# Patient Record
Sex: Female | Born: 1990 | Race: White | Hispanic: No | Marital: Married | Smoking: Never smoker
Health system: Southern US, Community
[De-identification: ages and names within clinical notes are randomized; demographics above are authoritative.]

## PROBLEM LIST (undated history)

## (undated) DIAGNOSIS — F419 Anxiety disorder, unspecified: Secondary | ICD-10-CM

## (undated) DIAGNOSIS — I10 Essential (primary) hypertension: Secondary | ICD-10-CM

## (undated) DIAGNOSIS — T7840XA Allergy, unspecified, initial encounter: Secondary | ICD-10-CM

## (undated) HISTORY — PX: JOINT REPLACEMENT: SHX530

## (undated) HISTORY — DX: Allergy, unspecified, initial encounter: T78.40XA

---

## 2008-12-09 ENCOUNTER — Ambulatory Visit: Payer: Self-pay | Admitting: Radiology

## 2008-12-09 ENCOUNTER — Ambulatory Visit (HOSPITAL_BASED_OUTPATIENT_CLINIC_OR_DEPARTMENT_OTHER): Admission: RE | Admit: 2008-12-09 | Discharge: 2008-12-09 | Payer: Self-pay | Admitting: Family Medicine

## 2009-01-14 ENCOUNTER — Ambulatory Visit: Payer: Self-pay | Admitting: Diagnostic Radiology

## 2009-01-14 ENCOUNTER — Ambulatory Visit (HOSPITAL_BASED_OUTPATIENT_CLINIC_OR_DEPARTMENT_OTHER): Admission: RE | Admit: 2009-01-14 | Discharge: 2009-01-14 | Payer: Self-pay | Admitting: Family Medicine

## 2012-09-24 ENCOUNTER — Emergency Department (HOSPITAL_BASED_OUTPATIENT_CLINIC_OR_DEPARTMENT_OTHER)
Admission: EM | Admit: 2012-09-24 | Discharge: 2012-09-24 | Disposition: A | Discharge status: Home / Self Care | Payer: Worker's Compensation | Attending: Emergency Medicine | Admitting: Emergency Medicine

## 2012-09-24 ENCOUNTER — Encounter (HOSPITAL_BASED_OUTPATIENT_CLINIC_OR_DEPARTMENT_OTHER): Payer: Self-pay | Admitting: *Deleted

## 2012-09-24 ENCOUNTER — Emergency Department (HOSPITAL_BASED_OUTPATIENT_CLINIC_OR_DEPARTMENT_OTHER): Payer: Worker's Compensation

## 2012-09-24 DIAGNOSIS — F411 Generalized anxiety disorder: Secondary | ICD-10-CM | POA: Insufficient documentation

## 2012-09-24 DIAGNOSIS — S99929A Unspecified injury of unspecified foot, initial encounter: Secondary | ICD-10-CM | POA: Insufficient documentation

## 2012-09-24 DIAGNOSIS — I10 Essential (primary) hypertension: Secondary | ICD-10-CM | POA: Insufficient documentation

## 2012-09-24 DIAGNOSIS — Z79899 Other long term (current) drug therapy: Secondary | ICD-10-CM | POA: Insufficient documentation

## 2012-09-24 DIAGNOSIS — M25569 Pain in unspecified knee: Secondary | ICD-10-CM

## 2012-09-24 DIAGNOSIS — Y929 Unspecified place or not applicable: Secondary | ICD-10-CM | POA: Insufficient documentation

## 2012-09-24 DIAGNOSIS — S8990XA Unspecified injury of unspecified lower leg, initial encounter: Secondary | ICD-10-CM | POA: Insufficient documentation

## 2012-09-24 DIAGNOSIS — W010XXA Fall on same level from slipping, tripping and stumbling without subsequent striking against object, initial encounter: Secondary | ICD-10-CM | POA: Insufficient documentation

## 2012-09-24 DIAGNOSIS — Y9389 Activity, other specified: Secondary | ICD-10-CM | POA: Insufficient documentation

## 2012-09-24 HISTORY — DX: Anxiety disorder, unspecified: F41.9

## 2012-09-24 HISTORY — DX: Essential (primary) hypertension: I10

## 2012-09-24 MED ORDER — ACETAMINOPHEN-CODEINE 120-12 MG/5ML PO SOLN: 5.0000 mL | Freq: Four times a day (QID) | ORAL | Status: DC | PRN

## 2012-09-24 NOTE — ED Notes (Signed)
MD at bedside. 

## 2012-09-24 NOTE — ED Notes (Signed)
Chart reviewed.

## 2012-09-24 NOTE — ED Provider Notes (Signed)
History     CSN: [id number]  Arrival date & time 09/24/12  1327   First MD Initiated Contact with Patient 09/24/12 1334      Chief Complaint  Patient presents with  . Knee Injury    (Consider location/radiation/quality/duration/timing/severity/associated sxs/prior treatment) HPI The patient presents immediately following a fall.  She recalls that she was walking, slipped, fell awkwardly backwards, but was called prior to hitting the ground.  She says that her left knee popped while she was in motion, and since that time has had pain persistently in left knee.  It is worse with motion, minimally better at rest.  She denies distal dysesthesia or weakness. No clear alleviating or exacerbating factors. No other,, no other complaints. The patient has a history of prior arthroscopic meniscal repair with patella relocation in the distant past. Past Medical History  Diagnosis Date  . Hypertension   . Anxiety     Past Surgical History  Procedure Date  . Joint replacement     History reviewed. No pertinent family history.  History  Substance Use Topics  . Smoking status: Never Smoker   . Smokeless tobacco: Not on file  . Alcohol Use: No    OB History    Grav Para Term Preterm Abortions TAB SAB Ect Mult Living                  Review of Systems  All other systems reviewed and are negative.    Allergies  Review of patient's allergies indicates no known allergies.  Home Medications   Current Outpatient Rx  Name  Route  Sig  Dispense  Refill  . CITALOPRAM HYDROBROMIDE 10 MG PO TABS   Oral   Take 10 mg by mouth daily.         Marland Kitchen METOPROLOL SUCCINATE ER 100 MG PO TB24   Oral   Take 100 mg by mouth daily. Take with or immediately following a meal.           BP 156/85  Pulse 102  Temp 98.6 F (37 C)  Resp 18  Ht 5\' 2"  (1.575 m)  Wt 250 lb (113.399 kg)  BMI 45.73 kg/m2  SpO2 100%  LMP 09/16/2012  Physical Exam  Nursing note and vitals  reviewed. Constitutional: She is oriented to person, place, and time. She appears well-developed and well-nourished. No distress.  HENT:  Head: Normocephalic and atraumatic.  Eyes: Conjunctivae normal and EOM are normal.  Cardiovascular: Normal rate and regular rhythm.   Pulmonary/Chest: Effort normal and breath sounds normal. No stridor. No respiratory distress.  Abdominal: She exhibits no distension.  Musculoskeletal: She exhibits no edema.       Left hip: Normal.       Right knee: Normal.       Left knee: She exhibits decreased range of motion, effusion and bony tenderness. She exhibits no ecchymosis, no deformity, no laceration, no erythema, normal alignment, no LCL laxity, normal patellar mobility, normal meniscus and no MCL laxity. tenderness found. Patellar tendon tenderness noted. No medial joint line, no lateral joint line, no MCL and no LCL tenderness noted.       Right ankle: Normal.       Left ankle: Normal.  Neurological: She is alert and oriented to person, place, and time. No cranial nerve deficit.  Skin: Skin is warm and dry.  Psychiatric: She has a normal mood and affect.    ED Course  Procedures (including critical care time)  Labs Reviewed -  No data to display No results found.   No diagnosis found.   I reviewed the XR findings with the patient. On re-eval she continues to have mild pain, no new complaints.  MDM  This young female presents after a fall with persistent left knee pain.  On exam she is in no distress.  There is tenderness to palpation with effusion about the patella.  The patient does have flexion and extension capacity.  There is some amount of range of motion secondary to the fusion.  There is mild tenderness to palpation of the patella, though on x-ray it is not grossly dislocated and the subluxation may be reflective of the patient's surgical procedure.  The patient has a knee immobilizer, was advised that she needs to wear this until she follows  up with her orthopedist as soon as possible.  With preserved distal pulses, sensation, neurologic status, there is low suspicion for transient dislocation of the knee.        Gerhard Munch, MD 09/24/12 [phone]

## 2012-09-24 NOTE — ED Notes (Signed)
Pt c/o fall from standing landing on tile floor x 1 hr ago, c/o left knee pain

## 2014-04-01 ENCOUNTER — Encounter (HOSPITAL_BASED_OUTPATIENT_CLINIC_OR_DEPARTMENT_OTHER): Payer: Self-pay | Admitting: Emergency Medicine

## 2014-04-01 ENCOUNTER — Emergency Department (HOSPITAL_BASED_OUTPATIENT_CLINIC_OR_DEPARTMENT_OTHER)
Admission: EM | Admit: 2014-04-01 | Discharge: 2014-04-01 | Disposition: A | Discharge status: Home / Self Care | Payer: BC Managed Care – PPO | Attending: Emergency Medicine | Admitting: Emergency Medicine

## 2014-04-01 DIAGNOSIS — Z79899 Other long term (current) drug therapy: Secondary | ICD-10-CM | POA: Insufficient documentation

## 2014-04-01 DIAGNOSIS — I10 Essential (primary) hypertension: Secondary | ICD-10-CM | POA: Insufficient documentation

## 2014-04-01 DIAGNOSIS — F410 Panic disorder [episodic paroxysmal anxiety] without agoraphobia: Secondary | ICD-10-CM | POA: Insufficient documentation

## 2014-04-01 DIAGNOSIS — I4949 Other premature depolarization: Secondary | ICD-10-CM | POA: Insufficient documentation

## 2014-04-01 DIAGNOSIS — I493 Ventricular premature depolarization: Secondary | ICD-10-CM

## 2014-04-01 DIAGNOSIS — R002 Palpitations: Secondary | ICD-10-CM | POA: Insufficient documentation

## 2014-04-01 LAB — CBC WITH DIFFERENTIAL/PLATELET
BASOS ABS: 0 10*3/uL (ref 0.0–0.1)
Basophils Relative: 0 % (ref 0–1)
EOS PCT: 3 % (ref 0–5)
Eosinophils Absolute: 0.3 10*3/uL (ref 0.0–0.7)
HCT: 41.3 % (ref 36.0–46.0)
Hemoglobin: 14 g/dL (ref 12.0–15.0)
LYMPHS PCT: 26 % (ref 12–46)
Lymphs Abs: 2.9 10*3/uL (ref 0.7–4.0)
MCH: 29.1 pg (ref 26.0–34.0)
MCHC: 33.9 g/dL (ref 30.0–36.0)
MCV: 85.9 fL (ref 78.0–100.0)
Monocytes Absolute: 0.9 10*3/uL (ref 0.1–1.0)
Monocytes Relative: 8 % (ref 3–12)
NEUTROS PCT: 64 % (ref 43–77)
Neutro Abs: 7.3 10*3/uL (ref 1.7–7.7)
PLATELETS: 410 10*3/uL — AB (ref 150–400)
RBC: 4.81 MIL/uL (ref 3.87–5.11)
RDW: 13.2 % (ref 11.5–15.5)
WBC: 11.4 10*3/uL — AB (ref 4.0–10.5)

## 2014-04-01 LAB — BASIC METABOLIC PANEL
BUN: 14 mg/dL (ref 6–23)
CALCIUM: 10.3 mg/dL (ref 8.4–10.5)
CO2: 25 meq/L (ref 19–32)
Chloride: 99 mEq/L (ref 96–112)
Creatinine, Ser: 0.7 mg/dL (ref 0.50–1.10)
GFR calc Af Amer: >90 mL/min (ref >90)
Glucose, Bld: 109 mg/dL — ABNORMAL HIGH (ref 70–99)
POTASSIUM: 4.1 meq/L (ref 3.7–5.3)
SODIUM: 139 meq/L (ref 137–147)

## 2014-04-01 NOTE — Discharge Instructions (Signed)
Palpitations  A palpitation is the feeling that your heartbeat is irregular or is faster than normal. It may feel like your heart is fluttering or skipping a beat. Palpitations are usually not a serious problem. However, in some cases, you may need further medical evaluation. CAUSES  Palpitations can be caused by:  Smoking.  Caffeine or other stimulants, such as diet pills or energy drinks.  Alcohol.  Stress and anxiety.  Strenuous physical activity.  Fatigue.  Certain medicines.  Heart disease, especially if you have a history of arrhythmias. This includes atrial fibrillation, atrial flutter, or supraventricular tachycardia.  An improperly working pacemaker or defibrillator. DIAGNOSIS  To find the cause of your palpitations, your caregiver will take your history and perform a physical exam. Tests may also be done, including:  Electrocardiography (ECG). This test records the heart's electrical activity.  Cardiac monitoring. This allows your caregiver to monitor your heart rate and rhythm in real time.  Holter monitor. This is a portable device that records your heartbeat and can help diagnose heart arrhythmias. It allows your caregiver to track your heart activity for several days, if needed.  Stress tests by exercise or by giving medicine that makes the heart beat faster. TREATMENT  Treatment of palpitations depends on the cause of your symptoms and can vary greatly. Most cases of palpitations do not require any treatment other than time, relaxation, and monitoring your symptoms. Other causes, such as atrial fibrillation, atrial flutter, or supraventricular tachycardia, usually require further treatment. HOME CARE INSTRUCTIONS   Avoid:  Caffeinated coffee, tea, soft drinks, diet pills, and energy drinks.  Chocolate.  Alcohol.  Stop smoking if you smoke.  Reduce your stress and anxiety. Things that can help you relax include:  A method that measures bodily functions so  you can learn to control them (biofeedback).  Yoga.  Meditation.  Physical activity such as swimming, jogging, or walking.  Get plenty of rest and sleep. SEEK MEDICAL CARE IF:   You continue to have a fast or irregular heartbeat beyond 24 hours.  Your palpitations occur more often. SEEK IMMEDIATE MEDICAL CARE IF:  You develop chest pain or shortness of breath.  You have a severe headache.  You feel dizzy, or you faint. MAKE SURE YOU:  Understand these instructions.  Will watch your condition.  Will get help right away if you are not doing well or get worse. Document Released: 10/13/2000 Document Revised: 02/10/2013 Document Reviewed: 12/15/2011 [facility] Patient Information 2014 [place], [place].  Premature Ventricular Contraction Premature ventricular contraction (PVC) is an irregularity of the heart rhythm involving extra or skipped heartbeats. In some cases, they may occur without obvious cause or heart disease. Other times, they can be caused by an electrolyte change in the blood. These need to be corrected. They can also be seen when there is not enough oxygen going to the heart. A common cause of this is plaque or cholesterol buildup. This buildup decreases the blood supply to the heart. In addition, extra beats may be caused or aggravated by:  Excessive smoking.  Alcohol consumption.  Caffeine.  Certain medications  Some street drugs. SYMPTOMS   The sensation of feeling your heart skipping a beat (palpitations).  In many cases, the person may have no symptoms. SIGNS AND TESTS   A physical examination may show an occasional irregularity, but if the PVC beats do not happen often, they may not be found on physical exam.  Blood pressure is usually normal.  Other tests that may find  extra beats of the heart are:  An EKG (electrocardiogram)  A Holter monitor which can monitor your heart over longer periods of time  An Angiogram (study of the heart  arteries). TREATMENT  Usually extra heartbeats do not need treatment. The condition is treated only if symptoms are severe or if extra beats are very frequent or are causing problems. An underlying cause, if discovered, may also require treatment.  Treatment may also be needed if there may be a risk for other more serious cardiac arrhythmias.  PREVENTION   Moderation in caffeine, alcohol, and tobacco use may reduce the risk of ectopic heartbeats in some people.  Exercise often helps people who lead a sedentary (inactive) lifestyle. PROGNOSIS  PVC heartbeats are generally harmless and do not need treatment.  RISKS AND COMPLICATIONS   Ventricular tachycardia (occasionally).  There usually are no complications.  Other arrhythmias (occasionally). SEEK IMMEDIATE MEDICAL CARE IF:   You feel palpitations that are frequent or continual.  You develop chest pain or other problems such as shortness of breath, sweating, or nausea and vomiting.  You become light-headed or faint (pass out).  You get worse or do not improve with treatment. Document Released: 06/02/2004 Document Revised: 01/08/2012 Document Reviewed: 12/13/2007 [facility] Patient Information 2014 [place], [place].

## 2014-04-01 NOTE — ED Provider Notes (Signed)
CSN: [id number]     Arrival date & time 04/01/14  1811 History  This chart was scribed for Connie Bucco, MD, by Connie Valencia, ED Scribe. This patient was seen in room MH02/MH02 and the patient's care was started at 7:16 PM.   First MD Initiated Contact with Patient 04/01/14 1858     Chief Complaint  Patient presents with  . Palpitations    The history is provided by the patient. No language interpreter was used.   HPI Comments: Connie Valencia is a 23 y.o. female, with a h/o anxiety, panic attacks, and PVCs, who presents to the Emergency Department complaining of heart palpitations which occurred twice today. The first episode occurred this morning, approximately five minutes after the pt had experienced a panic attack. The second episode began this evening, after she had been exercising. She states a h/o similar symptoms, though she reports the palpitations usually resolve more quickly. She states the palpitations seem to be correlated to panic attacks, with the experience of palpitations catalyzing a panic attack.  Connie Valencia denies SOB, nausea, emesis, leg swelling, chest pain, cough, congestion, or fever associated with the palpitations. She has worn a halter monitor in the past which indicated PVCs, but she has not been treated by a cardiologist. She states she has abstained from caffeine for 2 years. She also has not used supplements or diet pills. The pt is a non-smoker.   Her PCP is Dr. Riley Valencia with [facility].   Past Medical History  Diagnosis Date  . Hypertension   . Anxiety    Past Surgical History  Procedure Laterality Date  . Joint replacement     History reviewed. No pertinent family history. History  Substance Use Topics  . Smoking status: Never Smoker   . Smokeless tobacco: Not on file  . Alcohol Use: No   No OB history provided.  Review of Systems  Constitutional: Negative for fever, chills, diaphoresis and fatigue.  HENT: Negative for congestion,  rhinorrhea and sneezing.   Eyes: Negative.   Respiratory: Negative for cough, chest tightness and shortness of breath.   Cardiovascular: Positive for palpitations. Negative for chest pain and leg swelling.  Gastrointestinal: Negative for nausea, vomiting, abdominal pain, diarrhea and blood in stool.  Genitourinary: Negative for frequency, hematuria, flank pain and difficulty urinating.  Musculoskeletal: Negative for arthralgias and back pain.  Skin: Negative for rash.  Neurological: Negative for dizziness, speech difficulty, weakness, numbness and headaches.      Allergies  Review of patient's allergies indicates no known allergies.  Home Medications   Prior to Admission medications   Medication Sig Start Date End Date Taking? Authorizing Provider  citalopram (CELEXA) 10 MG tablet Take 10 mg by mouth daily.    Historical Provider, MD  metoprolol succinate (TOPROL-XL) 100 MG 24 hr tablet Take 100 mg by mouth daily. Take with or immediately following a meal.    Historical Provider, MD   Triage Vitals: BP 165/97  Pulse 112  Temp(Src) 99.1 F (37.3 C) (Oral)  Resp 16  Ht 5\' 2"  (1.575 m)  Wt 255 lb (115.667 kg)  BMI 46.63 kg/m2  SpO2 100%  LMP 03/30/2014  Physical Exam  Constitutional: She is oriented to person, place, and time. She appears well-developed and well-nourished.  HENT:  Head: Normocephalic and atraumatic.  Eyes: Pupils are equal, round, and reactive to light.  Neck: Normal range of motion. Neck supple.  Cardiovascular: Normal rate, regular rhythm and normal heart sounds.   Pulmonary/Chest: Effort  normal and breath sounds normal. No respiratory distress. She has no wheezes. She has no rales. She exhibits no tenderness.  Abdominal: Soft. Bowel sounds are normal. There is no tenderness. There is no rebound and no guarding.  Musculoskeletal: Normal range of motion. She exhibits tenderness (no calf tenderness). She exhibits no edema.  Lymphadenopathy:    She has no  cervical adenopathy.  Neurological: She is alert and oriented to person, place, and time.  Skin: Skin is warm and dry. No rash noted.  Psychiatric: She has a normal mood and affect.    ED Course  Procedures (including critical care time)  DIAGNOSTIC STUDIES: Oxygen Saturation is 100% on room air, normal by my interpretation.    COORDINATION OF CARE:  7:23 PM- Discussed treatment plan with patient, and the patient agreed to the plan. The plan includes an EKG and labs.   Labs Review Results for orders placed during the hospital encounter of 04/01/14  CBC WITH DIFFERENTIAL      Result Value Ref Range   WBC 11.4 (*) 4.0 - 10.5 K/uL   RBC 4.81  3.87 - 5.11 MIL/uL   Hemoglobin 14.0  12.0 - 15.0 g/dL   HCT 74.241.3  59.536.0 - 63.846.0 %   MCV 85.9  78.0 - 100.0 fL   MCH 29.1  26.0 - 34.0 pg   MCHC 33.9  30.0 - 36.0 g/dL   RDW 75.613.2  43.311.5 - 29.515.5 %   Platelets 410 (*) 150 - 400 K/uL   Neutrophils Relative % 64  43 - 77 %   Neutro Abs 7.3  1.7 - 7.7 K/uL   Lymphocytes Relative 26  12 - 46 %   Lymphs Abs 2.9  0.7 - 4.0 K/uL   Monocytes Relative 8  3 - 12 %   Monocytes Absolute 0.9  0.1 - 1.0 K/uL   Eosinophils Relative 3  0 - 5 %   Eosinophils Absolute 0.3  0.0 - 0.7 K/uL   Basophils Relative 0  0 - 1 %   Basophils Absolute 0.0  0.0 - 0.1 K/uL  BASIC METABOLIC PANEL      Result Value Ref Range   Sodium 139  137 - 147 mEq/L   Potassium 4.1  3.7 - 5.3 mEq/L   Chloride 99  96 - 112 mEq/L   CO2 25  19 - 32 mEq/L   Glucose, Bld 109 (*) 70 - 99 mg/dL   BUN 14  6 - 23 mg/dL   Creatinine, Ser 1.880.70  0.50 - 1.10 mg/dL   Calcium 41.610.3  8.4 - 60.610.5 mg/dL   GFR calc non Af Amer >90  >90 mL/min   GFR calc Af Amer >90  >90 mL/min   No results found.    Imaging Review No results found.   EKG Interpretation   Date/Time:  Wednesday April 01 2014 18:27:11 EDT Ventricular Rate:  93 PR Interval:  134 QRS Duration: 84 QT Interval:  360 QTC Calculation: 447 R Axis:   55 Text Interpretation:   Sinus rhythm with sinus arrhythmia with occasional  Premature ventricular complexes Otherwise normal ECG No old tracing to  compare Confirmed by Connie Smethurst  MD, Connie Valencia ([id number]) on 04/01/2014 8:01:35 PM      MDM   Final diagnoses:  Palpitations  PVC (premature ventricular contraction)    Patient is having palpitations. Her symptoms seem consistent with PVCs which are noted on EKG. No other arrhythmias are noted. She has not had any symptoms with PVCs such  as dizziness chest pain or shortness of breath. Tablet she can be discharged home with outpatient followup.  I personally performed the services described in this documentation, which was scribed in my presence.  The recorded information has been reviewed and considered.    Connie Bucco, MD 04/01/14 2003

## 2014-04-01 NOTE — ED Notes (Signed)
Pt c/o Palpitations started x 1 hr ago after exercising. Denies SOB n/v

## 2014-04-01 NOTE — ED Notes (Signed)
MD at bedside. 

## 2014-04-15 DIAGNOSIS — R7402 Elevation of levels of lactic acid dehydrogenase (LDH): Secondary | ICD-10-CM | POA: Insufficient documentation

## 2014-04-15 DIAGNOSIS — M25569 Pain in unspecified knee: Secondary | ICD-10-CM | POA: Insufficient documentation

## 2014-04-15 DIAGNOSIS — K7689 Other specified diseases of liver: Secondary | ICD-10-CM | POA: Insufficient documentation

## 2014-04-15 DIAGNOSIS — J302 Other seasonal allergic rhinitis: Secondary | ICD-10-CM | POA: Insufficient documentation

## 2014-04-15 DIAGNOSIS — R079 Chest pain, unspecified: Secondary | ICD-10-CM | POA: Insufficient documentation

## 2014-04-15 DIAGNOSIS — L918 Other hypertrophic disorders of the skin: Secondary | ICD-10-CM | POA: Insufficient documentation

## 2014-04-15 DIAGNOSIS — N920 Excessive and frequent menstruation with regular cycle: Secondary | ICD-10-CM | POA: Insufficient documentation

## 2014-04-15 DIAGNOSIS — H60509 Unspecified acute noninfective otitis externa, unspecified ear: Secondary | ICD-10-CM | POA: Insufficient documentation

## 2014-04-15 DIAGNOSIS — M2390 Unspecified internal derangement of unspecified knee: Secondary | ICD-10-CM | POA: Insufficient documentation

## 2014-04-15 DIAGNOSIS — M7050 Other bursitis of knee, unspecified knee: Secondary | ICD-10-CM | POA: Insufficient documentation

## 2014-04-15 DIAGNOSIS — L239 Allergic contact dermatitis, unspecified cause: Secondary | ICD-10-CM | POA: Insufficient documentation

## 2014-04-15 DIAGNOSIS — J309 Allergic rhinitis, unspecified: Secondary | ICD-10-CM | POA: Insufficient documentation

## 2014-04-15 DIAGNOSIS — I10 Essential (primary) hypertension: Secondary | ICD-10-CM | POA: Insufficient documentation

## 2014-04-15 DIAGNOSIS — L02439 Carbuncle of limb, unspecified: Secondary | ICD-10-CM | POA: Insufficient documentation

## 2014-04-15 DIAGNOSIS — K7581 Nonalcoholic steatohepatitis (NASH): Secondary | ICD-10-CM | POA: Insufficient documentation

## 2014-04-15 DIAGNOSIS — K760 Fatty (change of) liver, not elsewhere classified: Secondary | ICD-10-CM | POA: Insufficient documentation

## 2014-04-15 DIAGNOSIS — N926 Irregular menstruation, unspecified: Secondary | ICD-10-CM | POA: Insufficient documentation

## 2014-04-15 DIAGNOSIS — M25562 Pain in left knee: Secondary | ICD-10-CM | POA: Insufficient documentation

## 2014-04-15 DIAGNOSIS — F419 Anxiety disorder, unspecified: Secondary | ICD-10-CM | POA: Insufficient documentation

## 2014-04-15 DIAGNOSIS — F4322 Adjustment disorder with anxiety: Secondary | ICD-10-CM | POA: Insufficient documentation

## 2014-04-15 DIAGNOSIS — M545 Low back pain, unspecified: Secondary | ICD-10-CM | POA: Insufficient documentation

## 2014-04-28 IMAGING — CR DG KNEE COMPLETE 4+V*L*
4 series · 4 of 4 positions shown · non-contrast
Comparison: None.

CLINICAL DATA: Pain post trauma

LEFT KNEE - COMPLETE 4+ VIEW

[t knee ap left]
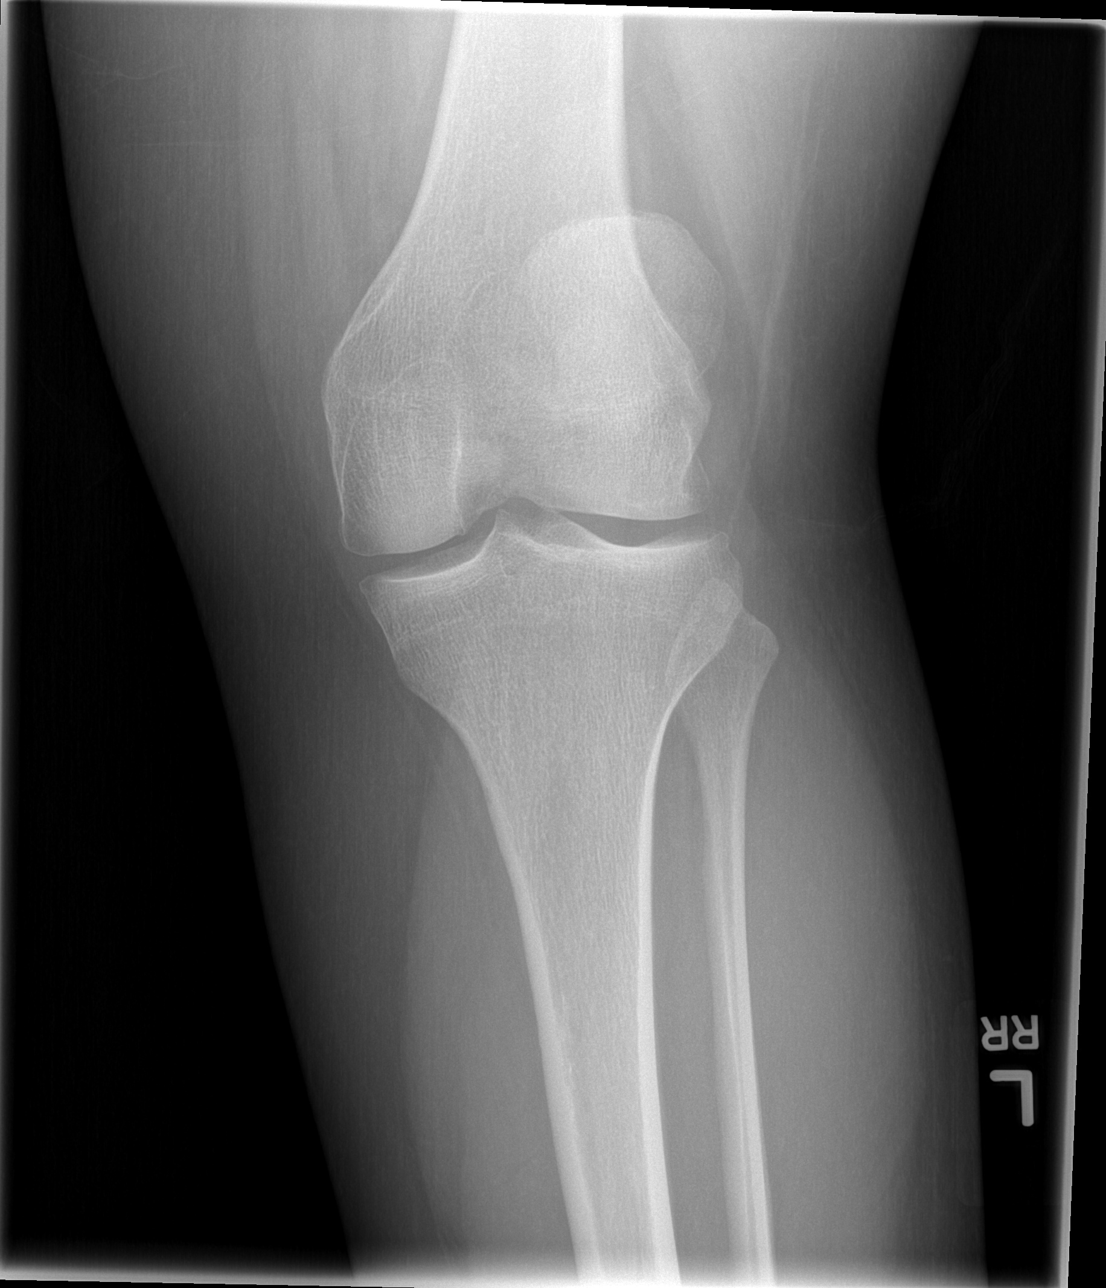

[t knee oblique left (1 of 2)]
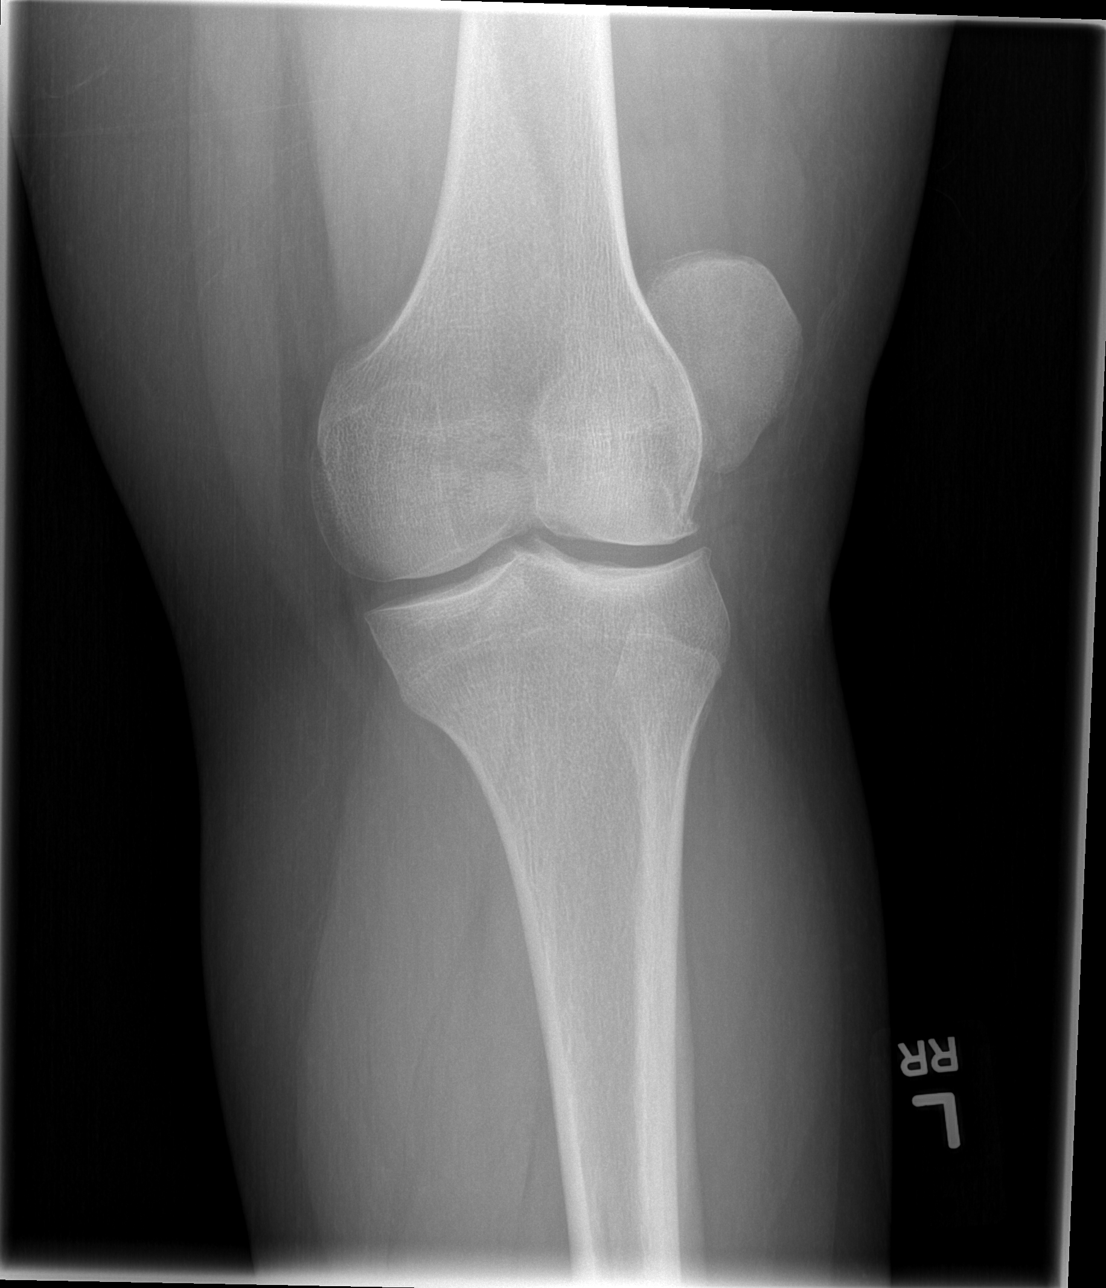

[t knee oblique left (2 of 2)]
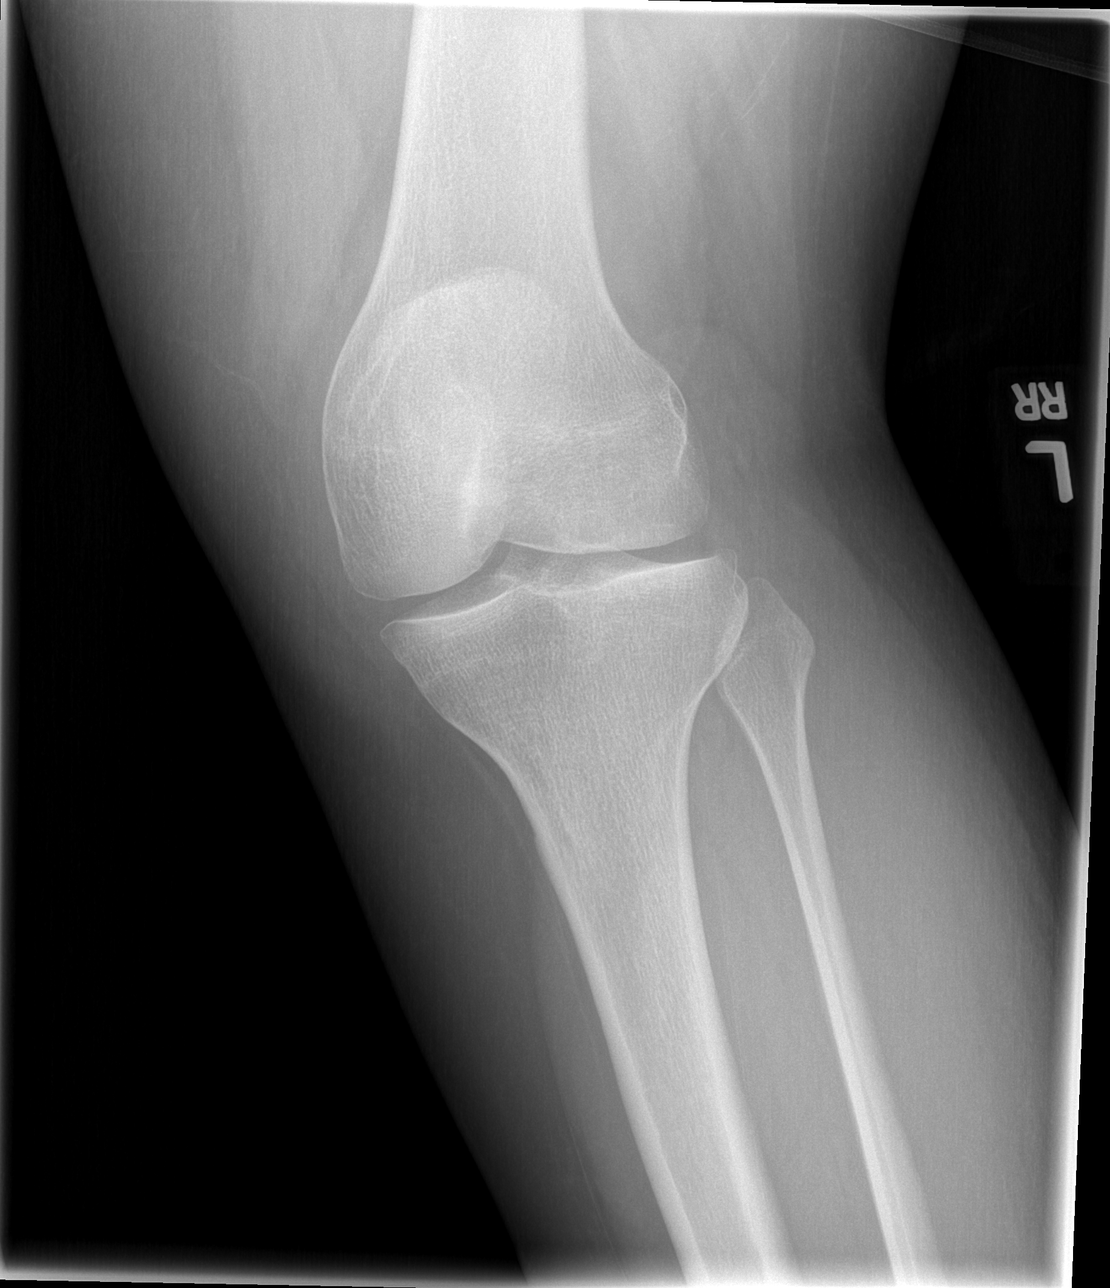

[t knee lat left]
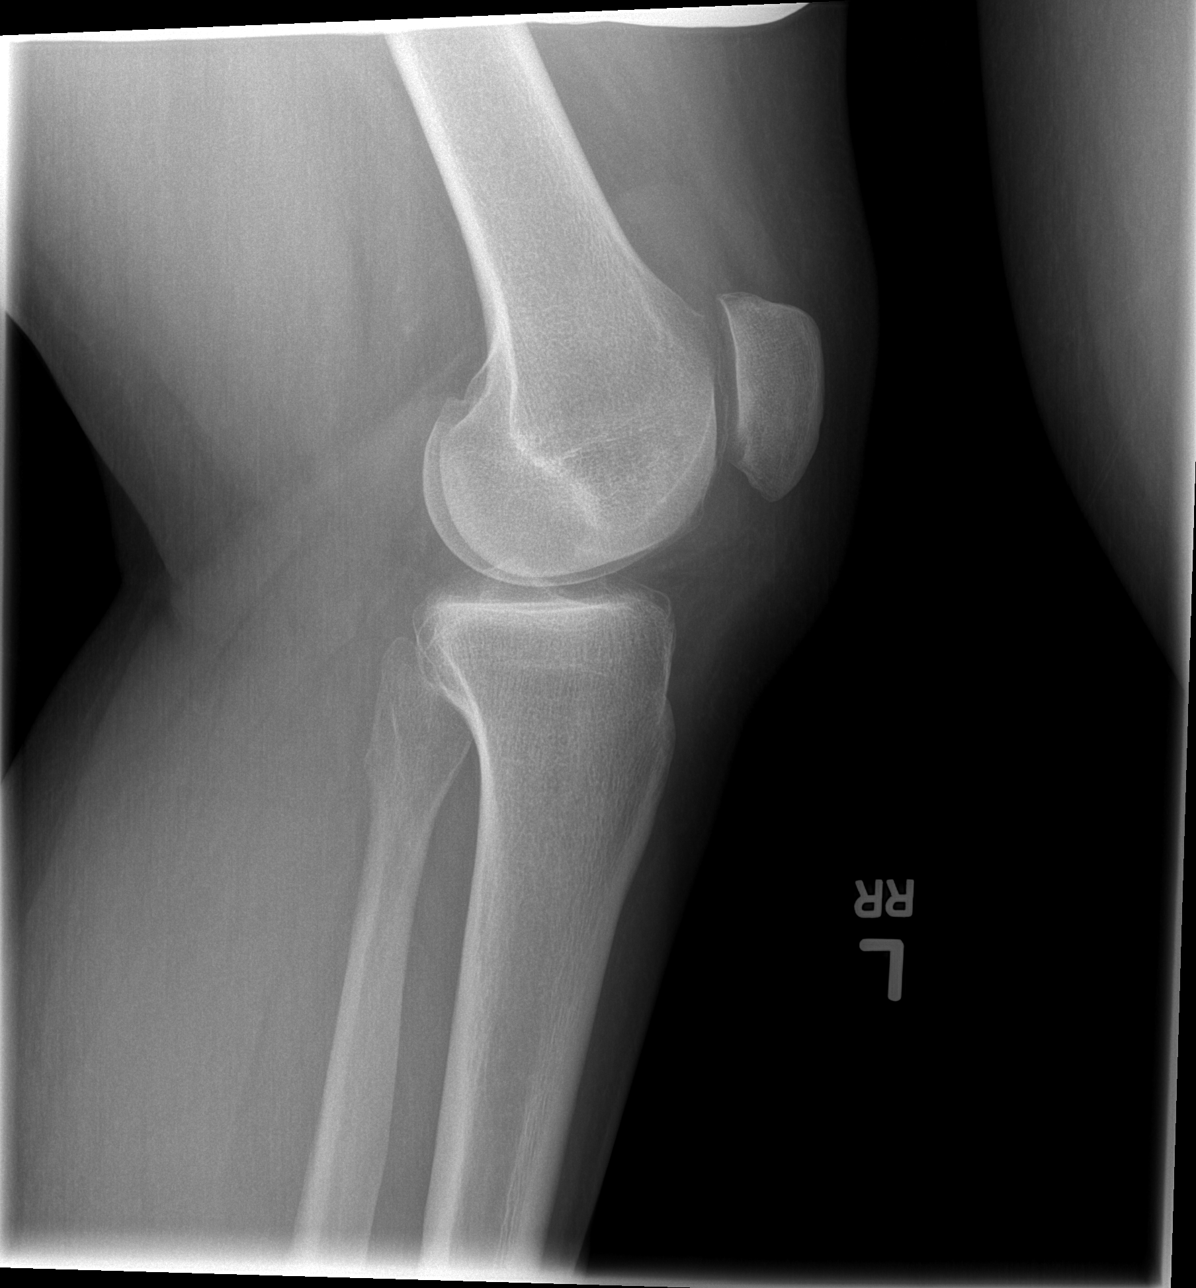

[4 of 4 positions shown; findings below may reference images not displayed]

FINDINGS: Frontal, lateral, and bilateral oblique views were
obtained.  There is lateral subluxation of the patella with joint
effusion.  There is no frank dislocation.  No fracture.  Joint
spaces appear intact.
IMPRESSION: Lateral patellar subluxation with joint effusion.  No
fracture appreciable.

## 2015-11-12 DIAGNOSIS — J40 Bronchitis, not specified as acute or chronic: Secondary | ICD-10-CM | POA: Insufficient documentation

## 2017-05-22 DIAGNOSIS — Z124 Encounter for screening for malignant neoplasm of cervix: Secondary | ICD-10-CM | POA: Insufficient documentation

## 2017-12-03 DIAGNOSIS — F410 Panic disorder [episodic paroxysmal anxiety] without agoraphobia: Secondary | ICD-10-CM | POA: Insufficient documentation

## 2019-04-07 DIAGNOSIS — F411 Generalized anxiety disorder: Secondary | ICD-10-CM | POA: Insufficient documentation

## 2019-11-19 DIAGNOSIS — E282 Polycystic ovarian syndrome: Secondary | ICD-10-CM | POA: Insufficient documentation

## 2020-01-13 ENCOUNTER — Telehealth: Payer: Self-pay | Admitting: Family Medicine

## 2020-01-14 ENCOUNTER — Other Ambulatory Visit: Payer: Self-pay | Admitting: Family Medicine

## 2020-01-14 DIAGNOSIS — I10 Essential (primary) hypertension: Secondary | ICD-10-CM

## 2020-01-14 NOTE — Telephone Encounter (Signed)
error 

## 2020-02-19 ENCOUNTER — Ambulatory Visit: Payer: Self-pay | Admitting: Nurse Practitioner

## 2020-03-02 ENCOUNTER — Encounter (HOSPITAL_BASED_OUTPATIENT_CLINIC_OR_DEPARTMENT_OTHER): Payer: Self-pay | Admitting: *Deleted

## 2020-03-02 ENCOUNTER — Emergency Department (HOSPITAL_BASED_OUTPATIENT_CLINIC_OR_DEPARTMENT_OTHER)
Admission: EM | Admit: 2020-03-02 | Discharge: 2020-03-02 | Disposition: A | Discharge status: Home / Self Care | Payer: BC Managed Care – PPO | Attending: Emergency Medicine | Admitting: Emergency Medicine

## 2020-03-02 ENCOUNTER — Other Ambulatory Visit: Payer: Self-pay

## 2020-03-02 DIAGNOSIS — S161XXA Strain of muscle, fascia and tendon at neck level, initial encounter: Secondary | ICD-10-CM | POA: Insufficient documentation

## 2020-03-02 DIAGNOSIS — I10 Essential (primary) hypertension: Secondary | ICD-10-CM | POA: Diagnosis not present

## 2020-03-02 DIAGNOSIS — Z79899 Other long term (current) drug therapy: Secondary | ICD-10-CM | POA: Insufficient documentation

## 2020-03-02 DIAGNOSIS — X58XXXA Exposure to other specified factors, initial encounter: Secondary | ICD-10-CM | POA: Insufficient documentation

## 2020-03-02 DIAGNOSIS — Y999 Unspecified external cause status: Secondary | ICD-10-CM | POA: Diagnosis not present

## 2020-03-02 DIAGNOSIS — Y939 Activity, unspecified: Secondary | ICD-10-CM | POA: Diagnosis not present

## 2020-03-02 DIAGNOSIS — S199XXA Unspecified injury of neck, initial encounter: Secondary | ICD-10-CM | POA: Diagnosis present

## 2020-03-02 DIAGNOSIS — Y929 Unspecified place or not applicable: Secondary | ICD-10-CM | POA: Insufficient documentation

## 2020-03-02 MED ORDER — CYCLOBENZAPRINE HCL 5 MG PO TABS: 10.0000 mg | ORAL_TABLET | Freq: Two times a day (BID) | ORAL | 0 refills | Status: DC | PRN

## 2020-03-02 NOTE — ED Triage Notes (Signed)
2 weeks ago she was sleeping on the couch and woke with a sore neck. She was seen at [facility] and given muscle relaxer with no relief. Pain now goes down her right scapula and arm.

## 2020-03-02 NOTE — Discharge Instructions (Signed)
Please take the new muscle relaxer at night.  Do not drink and drive on this medication as it can make you sleepy.  Please do not take in combination with the Robaxin.  Continue to take the meloxicam and alternate with Tylenol.  I have provided some stretching exercises.  Return to the ER if your symptoms worsen.

## 2020-03-03 NOTE — ED Provider Notes (Signed)
[facility] EMERGENCY DEPARTMENT Provider Note   CSN: [id number] Arrival date & time: 03/02/20  1825     History Chief Complaint  Patient presents with  . Neck Pain  . Back Pain    Connie Valencia is a 29 y.o. female.  HPI 29 year old female with a history of hypertension and anxiety presents to the ER for neck pain and right shoulder and arm pain x2 weeks.  Patient reports sleeping on the couch and waking up with a sore neck.  She describes the pain as a muscle spasm, throbbing.  She has no numbness or tingling in her right arm and no associated weakness.  She was seen in urgent care for 5 days ago and was given Robaxin which she states has not helped.  She continues to have pain worse with movement.  She has full range of motion of her right shoulder though she does note severe pain.  She denies any fevers, chills, falls, injuries, groin numbness, loss of bowel and bladder, night sweats, unintended weight loss, IVDU.    Past Medical History:  Diagnosis Date  . Anxiety   . Hypertension     There are no problems to display for this patient.   Past Surgical History:  Procedure Laterality Date  . JOINT REPLACEMENT       OB History   No obstetric history on file.     No family history on file.  Social History   Tobacco Use  . Smoking status: Never Smoker  . Smokeless tobacco: Never Used  Substance Use Topics  . Alcohol use: No  . Drug use: No    Home Medications Prior to Admission medications   Medication Sig Start Date End Date Taking? Authorizing Provider  citalopram (CELEXA) 10 MG tablet Take 10 mg by mouth daily.   Yes [provider]  letrozole (FEMARA) 2.5 MG tablet Take by mouth. 03/02/20 03/07/20 Yes [provider]  meloxicam (MOBIC) 15 MG tablet Take by mouth. 02/28/20 03/29/20 Yes [provider]  metFORMIN (GLUCOPHAGE-XR) 750 MG 24 hr tablet Take by mouth. 12/15/19  Yes [provider]  methocarbamol (ROBAXIN) 500  MG tablet Take by mouth. 02/28/20 03/09/20 Yes [provider]  metoprolol succinate (TOPROL-XL) 100 MG 24 hr tablet Take 100 mg by mouth daily. Take with or immediately following a meal.   Yes [provider]  cyclobenzaprine (FLEXERIL) 5 MG tablet Take 2 tablets (10 mg total) by mouth 2 (two) times daily as needed for muscle spasms. 03/02/20   Mare Ferrari, PA-C    Allergies    Patient has no known allergies.  Review of Systems   Review of Systems  Constitutional: Negative for chills and fever.  Musculoskeletal: Positive for neck pain and neck stiffness. Negative for arthralgias, back pain, joint swelling and myalgias.  Neurological: Negative for weakness, numbness and headaches.    Physical Exam Updated Vital Signs BP (!) 164/88 (BP Location: Left Arm)   Pulse 82   Temp 98.3 F (36.8 C) (Oral)   Resp 18   Ht 5\' 2"  (1.575 m)   Wt 115.7 kg   SpO2 98%   BMI 46.65 kg/m   Physical Exam Vitals and nursing note reviewed.  Constitutional:      General: She is not in acute distress.    Appearance: She is well-developed and normal weight. She is not ill-appearing or diaphoretic.  HENT:     Head: Normocephalic and atraumatic.     Nose: Nose  normal.     Mouth/Throat:     Mouth: Mucous membranes are moist.     Pharynx: Oropharynx is clear.  Eyes:     Extraocular Movements: Extraocular movements intact.     Conjunctiva/sclera: Conjunctivae normal.     Pupils: Pupils are equal, round, and reactive to light.  Neck:     Vascular: No carotid bruit.  Cardiovascular:     Rate and Rhythm: Normal rate and regular rhythm.     Pulses: Normal pulses.     Heart sounds: Normal heart sounds. No murmur.  Pulmonary:     Effort: Pulmonary effort is normal. No respiratory distress.     Breath sounds: Normal breath sounds.  Abdominal:     Palpations: Abdomen is soft.     Tenderness: There is no abdominal tenderness.  Musculoskeletal:        General: Tenderness present. No  swelling, deformity or signs of injury. Normal range of motion.     Cervical back: Neck supple. Tenderness present. No rigidity.     Right lower leg: No edema.     Left lower leg: No edema.     Comments: Tenderness to palpation to right paraspinal cervical musculature and trapezius muscles.  Some tenderness to palpation over deltoid.  No evidence of step-offs, crepitus, rashes.  2+ radial pulses bilaterally.  5/5 strength, sensations intact.  Full range of motion of shoulder.  No midline tenderness of C-spine.  Lymphadenopathy:     Cervical: No cervical adenopathy.  Skin:    General: Skin is warm and dry.     Findings: No bruising, erythema or rash.     Comments: Acanthosis nigricans noted along posterior neck  Neurological:     General: No focal deficit present.     Mental Status: She is alert and oriented to person, place, and time.     Sensory: No sensory deficit.     Motor: No weakness.     Gait: Gait normal.     Deep Tendon Reflexes: Reflexes normal.  Psychiatric:        Mood and Affect: Mood normal.        Behavior: Behavior normal.     ED Results / Procedures / Treatments   Labs (all labs ordered are listed, but only abnormal results are displayed) Labs Reviewed - No data to display  EKG None  Radiology No results found.  Procedures Procedures (including critical care time)  Medications Ordered in ED Medications - No data to display  ED Course  I have reviewed the triage vital signs and the nursing notes.  Pertinent labs & imaging results that were available during my care of the patient were reviewed by me and considered in my medical decision making (see chart for details).    MDM Rules/Calculators/A&P                     29 year old female with cervical muscular strain. Normal neurological exam, no evidence of urinary incontinence or retention, pain is consistently reproducible. There is no evidence of AAA or concern for dissection at this time. 2+RP  intact. No midline tenderness, normal strength and range of motion of shoulder and neck. No indication for imaging at this time.  Doubt dissection, no headaches, pain reproducible on exam. No fever, night sweats, weight loss, h/o cancer, IVDU. Patient taking meloxicam, expresses desire to continue taking this.  I will try different muscle relaxer, will prescribe 5 mg Flexeril.  Orthopedics referral provided if symptoms do not  improve.  Stretching exercises provided.  Return precautions given.  This states the patient has been medically screened and stable for discharge.  Final Clinical Impression(s) / ED Diagnoses Final diagnoses:  Acute strain of neck muscle, initial encounter    Rx / DC Orders ED Discharge Orders         Ordered    cyclobenzaprine (FLEXERIL) 5 MG tablet  2 times daily PRN     03/02/20 2031           Mare Ferrari, PA-C 03/03/20 1548    Alvira Monday, MD 03/10/20 [phone]

## 2020-08-10 DIAGNOSIS — E782 Mixed hyperlipidemia: Secondary | ICD-10-CM | POA: Insufficient documentation

## 2020-08-10 DIAGNOSIS — R7303 Prediabetes: Secondary | ICD-10-CM | POA: Insufficient documentation

## 2020-08-10 DIAGNOSIS — E559 Vitamin D deficiency, unspecified: Secondary | ICD-10-CM | POA: Insufficient documentation

## 2020-09-06 DIAGNOSIS — I493 Ventricular premature depolarization: Secondary | ICD-10-CM | POA: Insufficient documentation

## 2022-06-18 ENCOUNTER — Emergency Department (HOSPITAL_BASED_OUTPATIENT_CLINIC_OR_DEPARTMENT_OTHER): Payer: Self-pay

## 2022-06-18 ENCOUNTER — Other Ambulatory Visit: Payer: Self-pay

## 2022-06-18 ENCOUNTER — Emergency Department (HOSPITAL_BASED_OUTPATIENT_CLINIC_OR_DEPARTMENT_OTHER)
Admission: EM | Admit: 2022-06-18 | Discharge: 2022-06-18 | Disposition: A | Discharge status: Home / Self Care | Payer: Self-pay | Attending: Emergency Medicine | Admitting: Emergency Medicine

## 2022-06-18 ENCOUNTER — Encounter (HOSPITAL_BASED_OUTPATIENT_CLINIC_OR_DEPARTMENT_OTHER): Payer: Self-pay | Admitting: Emergency Medicine

## 2022-06-18 DIAGNOSIS — Y9351 Activity, roller skating (inline) and skateboarding: Secondary | ICD-10-CM | POA: Insufficient documentation

## 2022-06-18 DIAGNOSIS — S8392XA Sprain of unspecified site of left knee, initial encounter: Secondary | ICD-10-CM | POA: Insufficient documentation

## 2022-06-18 NOTE — ED Notes (Signed)
Pt discharged to home. Discharge instructions have been discussed with patient and/or family members. Pt verbally acknowledges understanding d/c instructions, and endorses comprehension to checkout at registration before leaving.  °

## 2022-06-18 NOTE — Discharge Instructions (Signed)
Recommend getting the knee compression sleeve at a pharmacy.  Recommend Tylenol, ibuprofen, ice.  Weightbearing as tolerated with crutches.  No strenuous activities until you are cleared by orthopedics.

## 2022-06-18 NOTE — ED Triage Notes (Signed)
Pt was roller skating today and fell. Pt having L knee pain. Can bear some weight.

## 2022-06-18 NOTE — ED Provider Notes (Signed)
[facility] EMERGENCY DEPARTMENT Provider Note   CSN: [id number] Arrival date & time: 06/18/22  1534     History  Chief Complaint  Patient presents with   Knee Injury    Connie Valencia is a 31 y.o. female.  Patient here with left knee pain after fall while rollerblading.  Feels like knee twisted and she landed directly on the left knee.  She has been able to bear weight since.  She is not on blood thinners.  Did not hit her head or lose consciousness.  No other extremity pain.  Nothing makes it worse or better.  The history is provided by the patient.       Home Medications Prior to Admission medications   Medication Sig Start Date End Date Taking? Authorizing Provider  citalopram (CELEXA) 10 MG tablet Take 10 mg by mouth daily.    [provider]  cyclobenzaprine (FLEXERIL) 5 MG tablet Take 2 tablets (10 mg total) by mouth 2 (two) times daily as needed for muscle spasms. 03/02/20   Mare Ferrari, PA-C  metFORMIN (GLUCOPHAGE-XR) 750 MG 24 hr tablet Take by mouth. 12/15/19   [provider]  metoprolol succinate (TOPROL-XL) 100 MG 24 hr tablet Take 100 mg by mouth daily. Take with or immediately following a meal.    [provider]      Allergies    Patient has no known allergies.    Review of Systems   Review of Systems  Physical Exam Updated Vital Signs BP (!) 140/92 (BP Location: Right Arm)   Pulse 86   Temp 98.3 F (36.8 C) (Oral)   Resp 18   SpO2 100%  Physical Exam Vitals and nursing note reviewed.  Constitutional:      General: She is not in acute distress.    Appearance: She is well-developed.  HENT:     Head: Normocephalic and atraumatic.  Eyes:     Conjunctiva/sclera: Conjunctivae normal.  Cardiovascular:     Rate and Rhythm: Normal rate and regular rhythm.     Heart sounds: No murmur heard. Pulmonary:     Effort: Pulmonary effort is normal. No respiratory distress.     Breath sounds: Normal breath sounds.   Abdominal:     Palpations: Abdomen is soft.     Tenderness: There is no abdominal tenderness.  Musculoskeletal:        General: Swelling and tenderness present. Normal range of motion.     Cervical back: Neck supple.     Comments: Appears to have good range of motion of the left lower extremity at the knee joint but with discomfort, there is some swelling to the patellar region but no obvious laxity of the knee joint.  Skin:    General: Skin is warm and dry.     Capillary Refill: Capillary refill takes less than 2 seconds.  Neurological:     General: No focal deficit present.     Mental Status: She is alert.  Psychiatric:        Mood and Affect: Mood normal.     ED Results / Procedures / Treatments   Labs (all labs ordered are listed, but only abnormal results are displayed) Labs Reviewed - No data to display  EKG None  Radiology DG Knee Complete 4 Views Left  Result Date: 06/18/2022 CLINICAL DATA:  Fall roller-skating, pain EXAM: LEFT KNEE - COMPLETE 4+ VIEW COMPARISON:  09/24/2012 FINDINGS: No evidence of fracture, dislocation, or joint effusion. No evidence of  arthropathy or other focal bone abnormality. Soft tissue edema anteriorly. IMPRESSION: No fracture or dislocation of the left knee. Soft tissue edema anteriorly. Electronically Signed   By: Jearld Lesch M.D.   On: 06/18/2022 16:07    Procedures Procedures    Medications Ordered in ED Medications - No data to display  ED Course/ Medical Decision Making/ A&P                           Medical Decision Making Amount and/or Complexity of Data Reviewed Radiology: ordered.   Connie Valencia is here with left knee pain after injury while rollerblading.  Fell on the left knee and also twisted the left knee.  She is been ambulatory since.  She is neurovascular neuromuscularly intact on exam.  She appears to have good range of motion but with discomfort.  No obvious laxity of the knee joint.  X-ray was obtained that  showed no fracture or dislocation.  There is some soft tissue edema anteriorly.  Overall suspect contusion but could be a sprain.  We will have her weight-bear as tolerated with crutches.  Recommend knee compression, Tylenol, ibuprofen, ice.  We will have her follow-up with orthopedics.  Discharged in good condition.  This chart was dictated using voice recognition software.  Despite best efforts to proofread,  errors can occur which can change the documentation meaning.         Final Clinical Impression(s) / ED Diagnoses Final diagnoses:  Sprain of left knee, unspecified ligament, initial encounter    Rx / DC Orders ED Discharge Orders     None         Virgina Norfolk, DO 06/18/22 1659

## 2022-06-20 ENCOUNTER — Encounter: Payer: Self-pay | Admitting: Physician Assistant

## 2022-06-20 ENCOUNTER — Ambulatory Visit: Payer: BC Managed Care – PPO | Admitting: Physician Assistant

## 2022-06-20 VITALS — Ht 61.5 in | Wt 284.4 lb

## 2022-06-20 DIAGNOSIS — M25562 Pain in left knee: Secondary | ICD-10-CM | POA: Diagnosis not present

## 2022-06-20 DIAGNOSIS — M25062 Hemarthrosis, left knee: Secondary | ICD-10-CM

## 2022-06-20 DIAGNOSIS — S83006A Unspecified dislocation of unspecified patella, initial encounter: Secondary | ICD-10-CM

## 2022-06-20 MED ORDER — LIDOCAINE HCL 1 % IJ SOLN
3.0000 mL | INTRAMUSCULAR | Status: AC | PRN
  Administered 2022-06-20: 3 mL

## 2022-06-20 NOTE — Progress Notes (Signed)
Office Visit Note   Patient: Connie Valencia           Date of Birth: Mar 18, 1991           MRN: [id number] Visit Date: 06/20/2022              Requested by: Angelica Chessman, MD [address] [place],  [place] [address] PCP: Angelica Chessman, MD  Chief Complaint  Patient presents with  . Left Knee - New Patient (Initial Visit)      HPI: Patient is a pleasant 31 year old woman with a 3-day history of left knee pain and swelling.  She states she was rollerskating and fell onto her left knee.  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  She said in 2009 she underwent arthroscopic surgery and a procedure to send to her patella better.  In 2013 she did have a dislocation.  She feels that when she fell a few days ago she did have a subluxation which self reduced.  She is currently on crutches and not wearing a brace she works as a Engineer, site & Plan: Visit Diagnoses:  1. Acute pain of left knee   2. Hemarthrosis of left knee     Plan: 70 cc of frank blood was aspirated from her left knee today.  She will go into a knee immobilizer.  I was ordered a stat MRI.  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  She will follow-up with either Dr. August Saucer or Dr. Roda Shutters.  In the meantime ice elevate I want her to wear the knee immobilizer for the most part all the time.  Follow-Up Instructions: Dr. August Saucer or Dr.xu after MRI  Ortho Exam  Patient is alert, oriented, no adenopathy, well-dressed, normal affect, normal respiratory effort. Left knee difficult to examine secondary to the acuteness of the injury.  She does have tenderness around the lateral patella border.  She is unable to do a straight leg raise but I am not sure if this is secondary to pain.  Distally her calves are soft and nontender pulses are intact.  X-rays were reviewed which did not show any acute fractures.  She does have a large palpable effusion no  cellulitis  Imaging: No results found. No images are attached to the encounter.  Labs: No results found for: "HGBA1C", "ESRSEDRATE", "CRP", "LABURIC", "REPTSTATUS", "GRAMSTAIN", "CULT", "LABORGA"   No results found for: "ALBUMIN", "PREALBUMIN", "CBC"  No results found for: "MG" No results found for: "VD25OH"  No results found for: "PREALBUMIN"    Latest Ref Rng & Units 04/01/2014    7:15 PM  CBC EXTENDED  WBC 4.0 - 10.5 K/uL 11.4   RBC 3.87 - 5.11 MIL/uL 4.81   Hemoglobin 12.0 - 15.0 g/dL 34.7   HCT 42.5 - 95.6 % 41.3   Platelets 150 - 400 K/uL 410   NEUT# 1.7 - 7.7 K/uL 7.3   Lymph# 0.7 - 4.0 K/uL 2.9      Body mass index is 52.87 kg/m.  Orders:  Orders Placed This Encounter  Procedures  . MR Knee Left w/o contrast   No orders of the defined types were placed in this encounter.    Procedures: Large Joint Inj: L knee on 06/20/2022 9:20 AM Indications: pain and diagnostic evaluation Details: 25 G 1.5 in needle, superolateral approach  Arthrogram: No  Medications: 3 mL lidocaine 1 % Aspirate: 70 mL bloody Outcome: tolerated well,  no immediate complications  After confirming allergies and obtaining verbal consent the area was sterilely prepped with alcohol and Betadine.  3 cc of lidocaine were injected.  After adequate anesthesia an 18-gauge aspiration was done.  70 cc of bloody fluid without any fat globules was aspirated from the knee.  Pressure was applied compression wrap was placed. Procedure, treatment alternatives, risks and benefits explained, specific risks discussed. Consent was given by the patient.    Clinical Data: No additional findings.  ROS:  All other systems negative, except as noted in the HPI. Review of Systems  Objective: Vital Signs: Ht 5' 1.5" (1.562 m)   Wt 284 lb 6.4 oz (129 kg)   BMI 52.87 kg/m   Specialty Comments:  No specialty comments available.  PMFS History: There are no problems to display for this patient.  Past  Medical History:  Diagnosis Date  . Anxiety   . Hypertension     History reviewed. No pertinent family history.  Past Surgical History:  Procedure Laterality Date  . JOINT REPLACEMENT     Social History   Occupational History  . Not on file  Tobacco Use  . Smoking status: Never  . Smokeless tobacco: Never  Substance and Sexual Activity  . Alcohol use: No  . Drug use: No  . Sexual activity: Never    Birth control/protection: Pill

## 2022-06-25 ENCOUNTER — Ambulatory Visit
Admission: RE | Admit: 2022-06-25 | Discharge: 2022-06-25 | Disposition: A | Discharge status: Home / Self Care | Payer: BC Managed Care – PPO | Source: Ambulatory Visit | Attending: Physician Assistant | Admitting: Physician Assistant

## 2022-06-25 DIAGNOSIS — M25062 Hemarthrosis, left knee: Secondary | ICD-10-CM

## 2022-06-25 DIAGNOSIS — M25562 Pain in left knee: Secondary | ICD-10-CM

## 2022-06-26 ENCOUNTER — Telehealth: Payer: Self-pay

## 2022-06-26 NOTE — Telephone Encounter (Signed)
Attempted to contact patient however had to leave a voicemail informing patient that she is needing to schedule a appointment to go over her MRI results with either Dr. Roda Shutters or Dr. August Saucer

## 2022-06-26 NOTE — Telephone Encounter (Signed)
-----   Message from [facility] Persons, [place] sent at 06/26/2022  8:40 AM EDT ----- Can we get her in to see Roda Shutters or August Saucer this week? ----- Message ----- From: Interface, Rad Results In Sent: 06/25/2022  10:37 AM EDT To: [place] Persons, PA

## 2022-07-05 ENCOUNTER — Ambulatory Visit: Payer: BC Managed Care – PPO | Admitting: Orthopedic Surgery

## 2022-07-05 ENCOUNTER — Encounter: Payer: Self-pay | Admitting: Orthopedic Surgery

## 2022-07-05 DIAGNOSIS — S83006A Unspecified dislocation of unspecified patella, initial encounter: Secondary | ICD-10-CM

## 2022-07-05 NOTE — Progress Notes (Signed)
Office Visit Note   Patient: Connie Valencia           Date of Birth: 1990/11/04           MRN: [id number] Visit Date: 07/05/2022 Requested by: Angelica Chessman, MD [address] [place],  [place] [address] PCP: Angelica Chessman, MD  Subjective: Chief Complaint  Patient presents with   Left Knee - Follow-up    MRI Review    HPI: Connie Valencia is a 31 y.o. female who presents to the office for MRI review.  Patient was recently seen by [facility] persons PA-C and referred for evaluation of left knee injury and left knee MRI.  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  First began in 2009 when she hopped off of a stage and hurt her knee.  Due to history of patellar subluxation she had an intervention at that time which was arthroscopy with lateral release.  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  Most recently she fell while rollerskating, landed on the knee.  This date of injury was 06/18/2022.  She saw Clerance Lav and had 70 cc of hemarthrosis aspirated from the left knee.  She has been ambulating full weightbearing with crutches and knee immobilizer since that event.  Pain is improved but still persistent to some degree.  She is married and undergoing fertility treatments with the use of IUI.  We need to see if she is pregnant which would necessitate the delay of any surgical intervention according to her fertility provider.  Has history of hypertension and anxiety for which she takes medications.  In her free time she enjoys watching TV and spending time with her husband.  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  MRI results revealed: MR Knee Left w/o contrast  Result Date: 06/25/2022 CLINICAL DATA:  Patellar dislocation EXAM: MRI OF THE LEFT KNEE WITHOUT CONTRAST TECHNIQUE: Multiplanar, multisequence MR imaging of the knee was performed. No  intravenous contrast was administered. COMPARISON:  X-ray 06/18/2022 FINDINGS: Technical Note: Despite efforts by the technologist and patient, motion artifact is present on today's exam and could not be eliminated. This reduces exam sensitivity and specificity. MENISCI Medial meniscus:  Intact. Lateral meniscus:  Intact. LIGAMENTS Cruciates: Intact ACL and PCL. Collaterals: Intact MCL. Lateral collateral ligament complex intact. CARTILAGE Patellofemoral: Osteochondral impaction injury of the inferior aspect of the medial patellar facet. Adjacent full-thickness cartilage fissure of the patellar apex. No trochlear cartilage defect. Medial:  No chondral defect. Lateral: Chondral thinning of the weight-bearing lateral compartment. MISCELLANEOUS Joint: Moderate-sized joint effusion. There are several small intra-articular loose bodies in the suprapatellar pouch, likely cartilage fragments. Edema within the superior aspect of Hoffa's fat. Popliteal Fossa:  No Baker's cyst. Intact popliteus tendon. Extensor Mechanism: Intact quadriceps and patellar tendons. Partially torn MPFL from its patellar attachment. Bones: Patella is laterally dislocated out of the trochlear groove. Osteochondral impaction fracture of the medial patella without significant displacement (series 5, image 27). Impaction injury of the peripheral aspect of the lateral femoral condyle with slight cortical irregularity, although no discrete fracture fragment. 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 TT-TG distance of 24 mm. Other: Soft tissue swelling at the anterior and posterior aspects of the knee and proximal lower leg. IMPRESSION: 1. Findings of patellar dislocation with impaction fractures of the medial patella  and lateral femoral condyle. Patella remains laterally dislocated out of the trochlear groove. 2. Partially torn MPFL from its patellar attachment. 3. Moderate-sized joint effusion with several small intra-articular loose  bodies in the suprapatellar pouch, likely cartilage fragments. 4. 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 TT-TG distance of 24 mm. 5. Intact menisci.  Intact cruciate and collateral ligaments. Electronically Signed   By: Duanne Guess D.O.   On: 06/25/2022 10:35                 ROS: All systems reviewed are negative as they relate to the chief complaint within the history of present illness.  Patient denies fevers or chills.  Assessment & Plan: Visit Diagnoses:  1. Patellar dislocation, initial encounter     Plan: Connie Valencia is a 31 y.o. female who presents to the office for evaluation of left knee injury.  She has left knee MRI that was reviewed today demonstrating severely hypoplastic trochlea, MPFL tear, increased TT-TG distance measuring 24 mm.  All of these factors are contributing to her continued patellar instability and the patella is laterally subluxated on the MRI scan and on exam today.  Position was very close to the right knee patellar position.  She has some chondral damage to the inferior medial aspect of the patella.  Had long discussion with [place] and her husband today about options.  In order to best avoid any future patellar instability episodes and minimize the development, impression is that patient would best benefit from left knee arthroscopy with MPFL reconstruction, tibial tubercle osteotomy, trochleaplasty, with procedure to address chondral irregularity at the same time based on how large defect appears on arthroscopy at time of procedure.  Unfortunately, trochleaplasty is not commonly done in this practice so plan to refer patient to Dr. [place] Case in [facility] who performs this procedure more regularly.  Patient and husband's questions were answered to their satisfaction.  Follow-up with this office as needed.  Follow-Up Instructions: No follow-ups on file.   Orders:  Orders Placed This Encounter  Procedures   Ambulatory referral to Orthopedic  Surgery   No orders of the defined types were placed in this encounter.     Procedures: No procedures performed   Clinical Data: No additional findings.  Objective: Vital Signs: There were no vitals taken for this visit.  Physical Exam:  Constitutional: Patient appears well-developed HEENT:  Head: Normocephalic Eyes:EOM are normal Neck: Normal range of motion Cardiovascular: Normal rate Pulmonary/chest: Effort normal Neurologic: Patient is alert Skin: Skin is warm Psychiatric: Patient has normal mood and affect  Ortho Exam: Ortho exam demonstrates left knee with small effusion.  She has significant laxity to MPFL compared to contralateral knee.  She is able to perform straight leg raise without extensor lag.  Tenderness over the medial aspect of the patella.  No tenderness over the medial or lateral joint lines.  She has range of motion from 0 degrees extension to 75 degrees of knee flexion before she resists and guards due to pain.  No abrasion or laceration or skin injury noted.  No tenderness over the patellar tendon, quadricep tendon.  Stable to Lachman exam and varus/valgus stress at 0 and 30 degrees.  Specialty Comments:  No specialty comments available.  Imaging: No results found.   PMFS History: Patient Active Problem List   Diagnosis Date Noted   Patellar dislocation, initial encounter 06/20/2022   Past Medical History:  Diagnosis Date   Anxiety    Hypertension  No family history on file.  Past Surgical History:  Procedure Laterality Date   JOINT REPLACEMENT     Social History   Occupational History   Not on file  Tobacco Use   Smoking status: Never   Smokeless tobacco: Never  Substance and Sexual Activity   Alcohol use: No   Drug use: No   Sexual activity: Never    Birth control/protection: Pill

## 2022-07-06 ENCOUNTER — Encounter: Payer: Self-pay | Admitting: Orthopedic Surgery

## 2022-08-09 DIAGNOSIS — M25362 Other instability, left knee: Secondary | ICD-10-CM | POA: Insufficient documentation

## 2023-12-03 ENCOUNTER — Ambulatory Visit: Payer: BC Managed Care – PPO | Admitting: Family Medicine

## 2023-12-03 ENCOUNTER — Encounter: Payer: Self-pay | Admitting: Family Medicine

## 2023-12-03 VITALS — BP 124/84 | HR 75 | Temp 98.6°F | Ht 61.5 in | Wt 275.0 lb

## 2023-12-03 DIAGNOSIS — E282 Polycystic ovarian syndrome: Secondary | ICD-10-CM

## 2023-12-03 DIAGNOSIS — F4322 Adjustment disorder with anxiety: Secondary | ICD-10-CM | POA: Diagnosis not present

## 2023-12-03 DIAGNOSIS — K760 Fatty (change of) liver, not elsewhere classified: Secondary | ICD-10-CM

## 2023-12-03 DIAGNOSIS — I10 Essential (primary) hypertension: Secondary | ICD-10-CM | POA: Diagnosis not present

## 2023-12-03 DIAGNOSIS — R7303 Prediabetes: Secondary | ICD-10-CM

## 2023-12-03 MED ORDER — OZEMPIC (0.25 OR 0.5 MG/DOSE) 2 MG/3ML ~~LOC~~ SOPN: PEN_INJECTOR | SUBCUTANEOUS | 3 refills | Status: DC

## 2023-12-03 MED ORDER — SERTRALINE HCL 100 MG PO TABS: 100.0000 mg | ORAL_TABLET | Freq: Every day | ORAL | 1 refills | Status: DC

## 2023-12-03 MED ORDER — METFORMIN HCL ER 750 MG PO TB24: 750.0000 mg | ORAL_TABLET | Freq: Two times a day (BID) | ORAL | 1 refills | Status: DC

## 2023-12-03 MED ORDER — METOPROLOL SUCCINATE ER 100 MG PO TB24: 100.0000 mg | ORAL_TABLET | Freq: Every day | ORAL | 3 refills | Status: DC
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
## 2023-12-04 ENCOUNTER — Encounter: Payer: Self-pay | Admitting: Family Medicine

## 2023-12-04 LAB — LIPID PANEL
Cholesterol: 187 mg/dL (ref <200)
HDL: 48 mg/dL — ABNORMAL LOW (ref >=50)
LDL Cholesterol (Calc): 112 mg/dL — ABNORMAL HIGH
Non-HDL Cholesterol (Calc): 139 mg/dL — ABNORMAL HIGH (ref <130)
Total CHOL/HDL Ratio: 3.9 (calc) (ref <5.0)
Triglycerides: 154 mg/dL — ABNORMAL HIGH (ref <150)

## 2023-12-04 LAB — COMPLETE METABOLIC PANEL WITH GFR
AG Ratio: 1.2 (calc) (ref 1.0–2.5)
ALT: 50 U/L — ABNORMAL HIGH (ref 6–29)
AST: 44 U/L — ABNORMAL HIGH (ref 10–30)
Albumin: 4 g/dL (ref 3.6–5.1)
Alkaline phosphatase (APISO): 88 U/L (ref 31–125)
BUN: 9 mg/dL (ref 7–25)
CO2: 29 mmol/L (ref 20–32)
Calcium: 9.2 mg/dL (ref 8.6–10.2)
Chloride: 101 mmol/L (ref 98–110)
Creat: 0.55 mg/dL (ref 0.50–0.97)
Globulin: 3.3 g/dL (ref 1.9–3.7)
Glucose, Bld: 84 mg/dL (ref 65–99)
Potassium: 4.4 mmol/L (ref 3.5–5.3)
Sodium: 138 mmol/L (ref 135–146)
Total Bilirubin: 0.6 mg/dL (ref 0.2–1.2)
Total Protein: 7.3 g/dL (ref 6.1–8.1)
eGFR: 123 mL/min/{1.73_m2} (ref >=60)

## 2023-12-04 LAB — CBC WITH DIFFERENTIAL/PLATELET
Absolute Lymphocytes: 2395 {cells}/uL (ref 850–3900)
Absolute Monocytes: 578 {cells}/uL (ref 200–950)
Basophils Absolute: 39 {cells}/uL (ref 0–200)
Basophils Relative: 0.5 %
Eosinophils Absolute: 300 {cells}/uL (ref 15–500)
Eosinophils Relative: 3.9 %
HCT: 39.3 % (ref 35.0–45.0)
Hemoglobin: 12.4 g/dL (ref 11.7–15.5)
MCH: 26.4 pg — ABNORMAL LOW (ref 27.0–33.0)
MCHC: 31.6 g/dL — ABNORMAL LOW (ref 32.0–36.0)
MCV: 83.8 fL (ref 80.0–100.0)
MPV: 9.1 fL (ref 7.5–12.5)
Monocytes Relative: 7.5 %
Neutro Abs: 4389 {cells}/uL (ref 1500–7800)
Neutrophils Relative %: 57 %
Platelets: 375 10*3/uL (ref 140–400)
RBC: 4.69 10*6/uL (ref 3.80–5.10)
RDW: 13.5 % (ref 11.0–15.0)
Total Lymphocyte: 31.1 %
WBC: 7.7 10*3/uL (ref 3.8–10.8)

## 2023-12-04 LAB — HEMOGLOBIN A1C
Hgb A1c MFr Bld: 5.7 %{Hb} — ABNORMAL HIGH (ref <5.7)
Mean Plasma Glucose: 117 mg/dL
eAG (mmol/L): 6.5 mmol/L

## 2023-12-31 ENCOUNTER — Ambulatory Visit (INDEPENDENT_AMBULATORY_CARE_PROVIDER_SITE_OTHER): Admitting: Internal Medicine

## 2023-12-31 ENCOUNTER — Other Ambulatory Visit: Payer: Self-pay

## 2023-12-31 VITALS — BP 131/80 | HR 80 | Temp 98.7°F | Ht 61.0 in | Wt 274.0 lb

## 2023-12-31 DIAGNOSIS — I10 Essential (primary) hypertension: Secondary | ICD-10-CM

## 2023-12-31 DIAGNOSIS — E282 Polycystic ovarian syndrome: Secondary | ICD-10-CM

## 2023-12-31 DIAGNOSIS — K7581 Nonalcoholic steatohepatitis (NASH): Secondary | ICD-10-CM

## 2023-12-31 DIAGNOSIS — Z6841 Body Mass Index (BMI) 40.0 and over, adult: Secondary | ICD-10-CM

## 2023-12-31 DIAGNOSIS — R7303 Prediabetes: Secondary | ICD-10-CM

## 2023-12-31 DIAGNOSIS — E66813 Obesity, class 3: Secondary | ICD-10-CM

## 2023-12-31 DIAGNOSIS — R29818 Other symptoms and signs involving the nervous system: Secondary | ICD-10-CM | POA: Insufficient documentation

## 2023-12-31 NOTE — Assessment & Plan Note (Signed)
 Patient has symptoms of sleep disordered breathing and high risk phenotype. Counseled on the risk of undiagnosed and untreated sleep apnea.  She was provided additional information.  She will be further screened at intake visit and we will consider polysomnography if indicated.  She has a history of frequent PVCs and is high risk for OSA.  Losing 15% of body weight may improve symptoms.

## 2023-12-31 NOTE — Assessment & Plan Note (Signed)
 She had mild elevation in AST and ALT recently, she has multiple risk factors for MASLD, she reports having an ultrasound showing hepatic steatosis but I could not find these test results.  Losing 15% of body weight may improve condition.  Also reducing saturated fats and simple and added sugars in diet.  Her insurance is no longer covering GLP-1 and it is cost prohibitive.  We will complete first tier evaluation at intake appointment.

## 2023-12-31 NOTE — Assessment & Plan Note (Signed)
 We reviewed anthropometrics, biometrics, associated medical conditions and contributing factors with patient. she would benefit from a medically tailored reduced calorie nutrional plan based on her REE (resting energy expenditure), which will be determined by indirect calorimetry.  We will also assess for cardiometabolic risk and nutritional derangements via fasting labs at intake appointment.

## 2023-12-31 NOTE — Addendum Note (Signed)
 Addended by: Renee Pain on: 12/31/2023 12:23 PM   Modules accepted: Orders

## 2023-12-31 NOTE — Assessment & Plan Note (Signed)
 Patient is not sure of type, but may have insulin resistance as a result.  She is currently on metformin and had been prescribed Ozempic by someone else for fertility but medication is no longer covered.  This may improve with total weight loss of 10 to 15%.  She will continue on metformin for now.  We will be checking fasting insulin levels with her intake labs.

## 2023-12-31 NOTE — Assessment & Plan Note (Signed)
 She reports having problems with high blood pressure since she was young she has been on metoprolol for a long period of time metoprolol may affect metabolic rate and cause weight gain.  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  Losing 10% of body weight may improve blood pressure control.

## 2023-12-31 NOTE — Progress Notes (Signed)
 Office: [phone]  /  Fax: [phone]   Initial Visit  Connie Valencia was seen in clinic today to evaluate for obesity. She is interested in losing weight to improve overall health and reduce the risk of weight related complications. She presents today to review program treatment options, initial physical assessment, and evaluation.     She was referred by: PCP  When asked what else they would like to accomplish? She states: Improve energy levels and physical activity, Improve existing medical conditions, and Improve quality of life  Weight history: Was overweight since a child. Has been on Metoprolol for many year  When asked how has your weight affected you? She states: Contributed to medical problems, Having fatigue, and Having poor endurance  Some associated conditions: Hypertension, Hyperlipidemia, MASLD, Prediabetes, PCOS, and Other: suspected OSA  Contributing factors: Family history of obesity, Disruption of circadian rhythm / sleep disordered breathing, Consumption of processed foods, Use of obesogenic medications: Beta-blockers, and Eating patterns  Weight promoting medications identified: Beta-blockers  Current nutrition plan: None  Current level of physical activity: NEAT  Current or previous pharmacotherapy: GLP-1 Ozempic - 6 months  Response to medication: Lost weight and was able to maintain weight loss   Past medical history includes:   Past Medical History:  Diagnosis Date   Allergy    Anxiety    Hypertension      Objective:   BP 131/80   Pulse 80   Temp 98.7 F (37.1 C)   Ht 5\' 1"  (1.549 m)   Wt 274 lb (124.3 kg)   LMP 11/08/2023 (Approximate)   SpO2 96%   BMI 51.77 kg/m  She was weighed on the bioimpedance scale: Body mass index is 51.77 kg/m.  Peak Weight:295 , Body Fat%:52, Visceral Fat Rating:18, Weight trend over the last 12 months: Decreasing  General:  Alert, oriented and cooperative. Patient is in no acute distress.  Respiratory:  Normal respiratory effort, no problems with respiration noted   Gait: able to ambulate independently  Mental Status: Normal mood and affect. Normal behavior. Normal judgment and thought content.   DIAGNOSTIC DATA REVIEWED:  BMET    Component Value Date/Time   NA 138 12/03/2023 0901   K 4.4 12/03/2023 0901   CL 101 12/03/2023 0901   CO2 29 12/03/2023 0901   GLUCOSE 84 12/03/2023 0901   BUN 9 12/03/2023 0901   CREATININE 0.55 12/03/2023 0901   CALCIUM 9.2 12/03/2023 0901   GFRNONAA >90 04/01/2014 1915   GFRAA >90 04/01/2014 1915   Lab Results  Component Value Date   HGBA1C 5.7 (H) 12/03/2023   No results found for: "INSULIN" CBC    Component Value Date/Time   WBC 7.7 12/03/2023 0901   RBC 4.69 12/03/2023 0901   HGB 12.4 12/03/2023 0901   HCT 39.3 12/03/2023 0901   PLT 375 12/03/2023 0901   MCV 83.8 12/03/2023 0901   MCH 26.4 (L) 12/03/2023 0901   MCHC 31.6 (L) 12/03/2023 0901   RDW 13.5 12/03/2023 0901   Iron/TIBC/Ferritin/ %Sat No results found for: "IRON", "TIBC", "FERRITIN", "IRONPCTSAT" Lipid Panel     Component Value Date/Time   CHOL 187 12/03/2023 0901   TRIG 154 (H) 12/03/2023 0901   HDL 48 (L) 12/03/2023 0901   CHOLHDL 3.9 12/03/2023 0901   LDLCALC 112 (H) 12/03/2023 0901   Hepatic Function Panel     Component Value Date/Time   PROT 7.3 12/03/2023 0901   AST 44 (H) 12/03/2023 0901   ALT 50 (H) 12/03/2023  0901   BILITOT 0.6 12/03/2023 0901   No results found for: "TSH"   Assessment and Plan:   Benign essential hypertension Assessment & Plan: She reports having problems with high blood pressure since she was young she has been on metoprolol for a long period of time metoprolol may affect metabolic rate and cause weight gain.  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  Losing 10% of body weight may improve blood pressure control.   Metabolic  dysfunction-associated steatohepatitis (MASH) Assessment & Plan: She had mild elevation in AST and ALT recently, she has multiple risk factors for MASLD, she reports having an ultrasound showing hepatic steatosis but I could not find these test results.  Losing 15% of body weight may improve condition.  Also reducing saturated fats and simple and added sugars in diet.  Her insurance is no longer covering GLP-1 and it is cost prohibitive.  We will complete first tier evaluation at intake appointment.   Prediabetes Assessment & Plan: Most recent A1c is  Lab Results  Component Value Date   HGBA1C 5.7 (H) 12/03/2023    Patient aware of disease state and risk of progression. This may contribute to abnormal cravings, fatigue and diabetic complications without having diabetes.   She benefit from maintaining a balanced diet low on simple and processed carbohydrates.  She is currently on metformin for pharmacoprophylaxis.  We will check fasting insulin levels with intake labs.    PCOS (polycystic ovarian syndrome) Assessment & Plan: Patient is not sure of type, but may have insulin resistance as a result.  She is currently on metformin and had been prescribed Ozempic by someone else for fertility but medication is no longer covered.  This may improve with total weight loss of 10 to 15%.  She will continue on metformin for now.  We will be checking fasting insulin levels with her intake labs.   Suspected sleep apnea Assessment & Plan: Patient has symptoms of sleep disordered breathing and high risk phenotype. Counseled on the risk of undiagnosed and untreated sleep apnea.  She was provided additional information.  She will be further screened at intake visit and we will consider polysomnography if indicated.  She has a history of frequent PVCs and is high risk for OSA.  Losing 15% of body weight may improve symptoms.    Class 3 severe obesity with serious comorbidity and body mass index (BMI)  of 50.0 to 59.9 in adult, unspecified obesity type [facility]) Assessment & Plan: We reviewed anthropometrics, biometrics, associated medical conditions and contributing factors with patient. she would benefit from a medically tailored reduced calorie nutrional plan based on her REE (resting energy expenditure), which will be determined by indirect calorimetry.  We will also assess for cardiometabolic risk and nutritional derangements via fasting labs at intake appointment.           Obesity Treatment / Action Plan:  Patient will work on garnering support from family and friends to begin weight loss journey. Will work on eliminating or reducing the presence of highly palatable, calorie dense foods in the home. Will complete provided nutritional and psychosocial assessment questionnaire before the next appointment. Will be scheduled for indirect calorimetry to determine resting energy expenditure in a fasting state.  This will allow [place] to create a reduced calorie, high-protein meal plan to promote loss of fat mass while preserving muscle mass. Counseled on the  health benefits of losing 5%-15% of total body weight. Was counseled on nutritional approaches to weight loss and benefits of reducing processed foods and consuming plant-based foods and high quality protein as part of nutritional weight management. Was counseled on pharmacotherapy and role as an adjunct in weight management.   Obesity Education Performed Today:  She was weighed on the bioimpedance scale and results were discussed and documented in the synopsis.  We discussed obesity as a disease and the importance of a more detailed evaluation of all the factors contributing to the disease.  We discussed the importance of long term lifestyle changes which include nutrition, exercise and behavioral modifications as well as the importance of customizing this to her specific health and social needs.  We discussed the benefits of reaching a  healthier weight to alleviate the symptoms of existing conditions and reduce the risks of the biomechanical, metabolic and psychological effects of obesity.  [name] appears to be in the action stage of change and states they are ready to start intensive lifestyle modifications and behavioral modifications.  I have spent 40 minutes in the care of the patient today including: preparing to see patient (e.g. review and interpretation of tests, old notes ), obtaining and/or reviewing separately obtained history, counseling and educating the patient, documenting clinical information in the electronic or other health care record, and independently interpreting results and communicating results to the patient, family, or caregiver   Reviewed by clinician on day of visit: allergies, medications, problem list, medical history, surgical history, family history, social history, and previous encounter notes pertinent to obesity diagnosis.   Worthy Rancher, MD

## 2023-12-31 NOTE — Assessment & Plan Note (Signed)
 Most recent A1c is  Lab Results  Component Value Date   HGBA1C 5.7 (H) 12/03/2023    Patient aware of disease state and risk of progression. This may contribute to abnormal cravings, fatigue and diabetic complications without having diabetes.   She benefit from maintaining a balanced diet low on simple and processed carbohydrates.  She is currently on metformin for pharmacoprophylaxis.  We will check fasting insulin levels with intake labs.

## 2024-01-02 ENCOUNTER — Encounter (INDEPENDENT_AMBULATORY_CARE_PROVIDER_SITE_OTHER): Payer: Self-pay

## 2024-01-28 ENCOUNTER — Ambulatory Visit: Payer: BC Managed Care – PPO | Admitting: Family Medicine

## 2024-02-29 ENCOUNTER — Ambulatory Visit: Payer: BC Managed Care – PPO | Admitting: Family Medicine

## 2024-05-31 ENCOUNTER — Other Ambulatory Visit: Payer: Self-pay | Admitting: Family Medicine

## 2024-05-31 DIAGNOSIS — E282 Polycystic ovarian syndrome: Secondary | ICD-10-CM

## 2024-06-02 NOTE — Telephone Encounter (Signed)
 Courtesy refill. Patient will need an office visit for additional refills.

## 2024-06-28 ENCOUNTER — Other Ambulatory Visit: Payer: Self-pay | Admitting: Family Medicine

## 2024-06-28 DIAGNOSIS — F4322 Adjustment disorder with anxiety: Secondary | ICD-10-CM

## 2024-07-01 NOTE — Telephone Encounter (Signed)
 Requested medication (s) are due for refill today: yes  Requested medication (s) are on the active medication list: yes  Last refill:  12/03/23 #90 1 RF  Future visit scheduled: yes  Notes to clinic:  abnormal lab work   Requested Prescriptions  Pending Prescriptions Disp Refills   sertraline  (ZOLOFT ) 100 MG tablet [Pharmacy Med Name: SERTRALINE  HCL 100 MG TABLET] 90 tablet 1    Sig: TAKE 1 TABLET BY MOUTH DAILY     Psychiatry:  Antidepressants - SSRI - sertraline  Failed - 07/01/2024 10:38 AM      Failed - AST in normal range and within 360 days    AST  Date Value Ref Range Status  12/03/2023 44 (H) 10 - 30 U/L Final         Failed - ALT in normal range and within 360 days    ALT  Date Value Ref Range Status  12/03/2023 50 (H) 6 - 29 U/L Final         Failed - Valid encounter within last 6 months    Recent Outpatient Visits           7 months ago Benign essential hypertension   [facility] Aletha Bene, MD              Passed - Completed PHQ-2 or PHQ-9 in the last 360 days

## 2024-07-05 ENCOUNTER — Other Ambulatory Visit: Payer: Self-pay | Admitting: Family Medicine

## 2024-07-05 DIAGNOSIS — E282 Polycystic ovarian syndrome: Secondary | ICD-10-CM

## 2024-07-07 NOTE — Telephone Encounter (Signed)
 Requested Prescriptions  Pending Prescriptions Disp Refills   metFORMIN  (GLUCOPHAGE -XR) 750 MG 24 hr tablet [Pharmacy Med Name: metFORMIN  HCL XR 750 MG TABLET] 30 tablet 0    Sig: TAKE 1 TABLET BY MOUTH 2 TIMES A DAY     Endocrinology:  Diabetes - Biguanides Failed - 07/07/2024 11:42 AM      Failed - HBA1C is between 0 and 7.9 and within 180 days    Hgb A1c MFr Bld  Date Value Ref Range Status  12/03/2023 5.7 (H) <5.7 % of total Hgb Final    Comment:    For someone without known diabetes, a hemoglobin  A1c value between 5.7% and 6.4% is consistent with prediabetes and should be confirmed with a  follow-up test. . For someone with known diabetes, a value <7% indicates that their diabetes is well controlled. A1c targets should be individualized based on duration of diabetes, age, comorbid conditions, and other considerations. . This assay result is consistent with an increased risk of diabetes. . Currently, no consensus exists regarding use of hemoglobin A1c for diagnosis of diabetes for children. .          Failed - B12 Level in normal range and within 720 days    No results found for: VITAMINB12       Failed - Valid encounter within last 6 months    Recent Outpatient Visits           7 months ago Benign essential hypertension   [facility] Aletha Bene, MD              Passed - Cr in normal range and within 360 days    Creat  Date Value Ref Range Status  12/03/2023 0.55 0.50 - 0.97 mg/dL Final         Passed - eGFR in normal range and within 360 days    GFR calc Af Amer  Date Value Ref Range Status  04/01/2014 >90 >90 mL/min Final    Comment:    (NOTE) The eGFR has been calculated using the CKD EPI equation. This calculation has not been validated in all clinical situations. eGFR's persistently <90 mL/min signify possible Chronic Kidney Disease.   GFR calc non Af Amer  Date Value Ref Range Status  04/01/2014 >90 >90  mL/min Final   eGFR  Date Value Ref Range Status  12/03/2023 123 > OR = 60 mL/min/1.60m2 Final         Passed - CBC within normal limits and completed in the last 12 months    WBC  Date Value Ref Range Status  12/03/2023 7.7 3.8 - 10.8 Thousand/uL Final   RBC  Date Value Ref Range Status  12/03/2023 4.69 3.80 - 5.10 Million/uL Final   Hemoglobin  Date Value Ref Range Status  12/03/2023 12.4 11.7 - 15.5 g/dL Final   HCT  Date Value Ref Range Status  12/03/2023 39.3 35.0 - 45.0 % Final   MCHC  Date Value Ref Range Status  12/03/2023 31.6 (L) 32.0 - 36.0 g/dL Final    Comment:    For adults, a slight decrease in the calculated MCHC value (in the range of 30 to 32 g/dL) is most likely not clinically significant; however, it should be interpreted with caution in correlation with other red cell parameters and the patient's clinical condition.    Dr [facility]  Date Value Ref Range Status  12/03/2023 26.4 (L) 27.0 - 33.0 pg Final   MCV  Date  Value Ref Range Status  12/03/2023 83.8 80.0 - 100.0 fL Final   No results found for: PLTCOUNTKUC, LABPLAT, POCPLA RDW  Date Value Ref Range Status  12/03/2023 13.5 11.0 - 15.0 % Final

## 2024-07-14 ENCOUNTER — Ambulatory Visit: Admitting: Family Medicine

## 2024-07-14 ENCOUNTER — Encounter: Payer: Self-pay | Admitting: Family Medicine

## 2024-07-14 VITALS — BP 122/88 | HR 74 | Temp 98.4°F | Ht 61.0 in | Wt 292.4 lb

## 2024-07-14 DIAGNOSIS — I10 Essential (primary) hypertension: Secondary | ICD-10-CM | POA: Diagnosis not present

## 2024-07-14 DIAGNOSIS — R202 Paresthesia of skin: Secondary | ICD-10-CM

## 2024-07-14 DIAGNOSIS — E782 Mixed hyperlipidemia: Secondary | ICD-10-CM | POA: Diagnosis not present

## 2024-07-14 DIAGNOSIS — E282 Polycystic ovarian syndrome: Secondary | ICD-10-CM

## 2024-07-14 DIAGNOSIS — F4322 Adjustment disorder with anxiety: Secondary | ICD-10-CM

## 2024-07-14 DIAGNOSIS — N921 Excessive and frequent menstruation with irregular cycle: Secondary | ICD-10-CM

## 2024-07-14 DIAGNOSIS — R7303 Prediabetes: Secondary | ICD-10-CM | POA: Diagnosis not present

## 2024-07-14 DIAGNOSIS — R200 Anesthesia of skin: Secondary | ICD-10-CM

## 2024-07-14 MED ORDER — METFORMIN HCL ER 750 MG PO TB24: 750.0000 mg | ORAL_TABLET | Freq: Two times a day (BID) | ORAL | 1 refills | Status: AC

## 2024-07-14 MED ORDER — ALPRAZOLAM 0.25 MG PO TABS: 0.2500 mg | ORAL_TABLET | Freq: Every day | ORAL | 0 refills | Status: AC | PRN

## 2024-07-14 MED ORDER — SERTRALINE HCL 100 MG PO TABS: 100.0000 mg | ORAL_TABLET | Freq: Every day | ORAL | 1 refills | Status: AC

## 2024-07-14 MED ORDER — METOPROLOL SUCCINATE ER 100 MG PO TB24: 100.0000 mg | ORAL_TABLET | Freq: Every day | ORAL | 3 refills | Status: AC

## 2024-07-14 NOTE — Progress Notes (Signed)
 Patient Office Visit  Assessment & Plan:  Benign essential hypertension -     CBC with Differential/Platelet -     Comprehensive metabolic panel with GFR -     Metoprolol  Succinate ER; Take 1 tablet (100 mg total) by mouth daily. Take with or immediately following a meal.  Dispense: 90 tablet; Refill: 3  Adjustment disorder with anxiety -     ALPRAZolam ; Take 1 tablet (0.25 mg total) by mouth daily as needed.  Dispense: 30 tablet; Refill: 0 -     TSH -     Sertraline  HCl; Take 1 tablet (100 mg total) by mouth daily.  Dispense: 90 tablet; Refill: 1  Mixed hyperlipidemia -     Lipid panel  Prediabetes -     Hemoglobin A1c  Numbness and tingling in left hand -     Nerve conduction test; Future -     Vitamin B12  Menorrhagia with irregular cycle -     Ambulatory referral to Obstetrics / Gynecology -     Iron, TIBC and Ferritin Panel  PCOS (polycystic ovarian syndrome) -     metFORMIN  HCl ER; Take 1 tablet (750 mg total) by mouth 2 (two) times daily.  Dispense: 180 tablet; Refill: 1   Assessment and Plan    Excessive and frequent menstruation with irregular cycle in the setting of polycystic ovarian syndrome Menorrhagia for two months with fatigue. PCOS present. Prefers to avoid estrogen and IUD. - Refer to gynecologist for menorrhagia management. - Check iron levels for anemia.  left hand numbness and tingling, suspect carpal tunnel syndrome Numbness and tingling in right hand, especially thumb, for six months. Symptoms worsen with driving. Suspect carpal tunnel syndrome. - Order nerve conduction study for carpal tunnel syndrome.  Essential hypertension Compliant with medication. - Continue metoprolol . - Refill metoprolol  prescription.  Prediabetes Taking metformin , one tablet instead of prescribed dose. - Continue metformin . - Refill metformin  prescription.  Mixed hyperlipidemia  Depression and adjustment disorder with anxiety Managed with sertraline . No  significant side effects. Xanax  used as needed for flying. - Continue sertraline . - Refill sertraline  prescription. - Prescribe Xanax  for flights.  General Health Maintenance Allergic to flu vaccine. Discussed diet, encouraged vegetables and protein. - Encourage diet rich in vegetables and protein.     OB-GYN consult ordered. Follow up on lab work and notify patient. Nerve conduction studies ordered to r/o carpal tunnel syndrome. May consider starting Gabapentin to help with hand numbness/tingling.  Recommend healthy diet i.e mediterranean/DASH diet, consistent exercise - 30 minutes 5 day per week, and gradual weight loss. Return in 4 months (on 11/13/2024), or if symptoms worsen or fail to improve.   Subjective:    Patient ID: Connie Valencia, female    DOB: 1991-01-13  Age: 33 y.o. MRN: [id number]  Chief Complaint  Patient presents with   Medical Management of Chronic Issues    HPI Discussed the use of AI scribe software for clinical note transcription with the patient, who gave verbal consent to proceed.  History of Present Illness        Connie Valencia is a 33 year old female with polycystic ovarian syndrome who presents with prolonged menstrual bleeding, left hand numbness, hypertension, prediabetes and anxiety  She has experienced prolonged menstrual bleeding for approximately two months, beginning around May 16, 2024. The bleeding is heavy but does not typically require changes of clothes at night. She reports feeling very, very tired, and her mother is concerned about her tiredness.  She has a history of polycystic ovarian syndrome and has not seen her gynecologist for some time. She previously underwent fertility treatments and an endometrial biopsy, which was painful.  She reports numbness and tingling in her left hand, particularly affecting the thumb and extending to all fingers, ongoing for about six months. The symptoms are most noticeable when driving or holding objects,  and she avoids sleeping on her left hand to prevent numbness. No weakness or dropping objects, but there is discomfort when holding items for extended periods. she is right hand dominant.   Her current medications include sertraline , which she finds helpful without side effects for anxiety For blood pressure, she takes metoprolol  for blood pressure management. She takes metformin , initially prescribed for fertility and prediabetes, but only takes one pill occasionally. She uses Xanax  as needed for flying, though she has not flown recently.  She is allergic to flu shots, a condition shared with her mother. She denies smoking and drug use, and reports a diet that includes spinach and liver for iron intake. 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 [place] and has family dinners regularly. Physical Exam NEUROLOGICAL: Tingling sensation in left hand, thumb first. Results Assessment & Plan Excessive and frequent menstruation with irregular cycle in the setting of polycystic ovarian syndrome Menorrhagia for two months with fatigue. PCOS present. Prefers to avoid estrogen and IUD. - Refer to gynecologist for menorrhagia management. - Check iron levels for anemia.  left hand numbness and tingling, suspect carpal tunnel syndrome Numbness and tingling in right hand, especially thumb, for six months. Symptoms worsen with driving. Suspect carpal tunnel syndrome. - Order nerve conduction study for carpal tunnel syndrome.  Essential hypertension Compliant with medication. - Continue metoprolol . - Refill metoprolol  prescription.  Prediabetes Taking metformin , one tablet instead of prescribed dose. - Continue metformin . - Refill metformin  prescription.  Mixed hyperlipidemia  Depression and adjustment disorder with anxiety Managed with sertraline . No significant side effects. Xanax  used as needed for flying. - Continue  sertraline . - Refill sertraline  prescription. - Prescribe Xanax  for flights.  General Health Maintenance Allergic to flu vaccine. Discussed diet, encouraged vegetables and protein. - Encourage diet rich in vegetables and protein.    The ASCVD Risk score (Arnett DK, et al., 2019) failed to calculate for the following reasons:   The 2019 ASCVD risk score is only valid for ages 54 to 17  Past Medical History:  Diagnosis Date   Allergy    Anxiety    Hypertension    Past Surgical History:  Procedure Laterality Date   JOINT REPLACEMENT Left    knee- 10/17/2022   Social History   Tobacco Use   Smoking status: Never   Smokeless tobacco: Never  Vaping Use   Vaping status: Never Used  Substance Use Topics   Alcohol use: No   Drug use: No   Family History  Problem Relation Age of Onset   Diabetes Mother    Heart disease Mother    Obesity Mother    Diabetes Father    Hypertension Father    Obesity Father    Allergies  Allergen Reactions   Egg-Derived Products Cough and Other (See Comments)    Increased congestion and postnasal drainage causing paroxysmal coughing   Influenza Vaccines Cough   Other Rash    Yogurt    ROS    Objective:    BP 122/88   Pulse 74  Temp 98.4 F (36.9 C)   Ht 5' 1 (1.549 m)   Wt 292 lb 6 oz (132.6 kg)   LMP 05/16/2024 Comment: Pt states that she has been on her menstrual cycle for 67mo.  SpO2 98%   BMI 55.24 kg/m  BP Readings from Last 3 Encounters:  07/14/24 122/88  12/31/23 131/80  12/03/23 124/84   Wt Readings from Last 3 Encounters:  07/14/24 292 lb 6 oz (132.6 kg)  12/31/23 274 lb (124.3 kg)  12/03/23 275 lb (124.7 kg)    Physical Exam Vitals and nursing note reviewed.  Constitutional:      General: She is not in acute distress.    Appearance: Normal appearance.  HENT:     Head: Normocephalic.     Right Ear: Tympanic membrane, ear canal and external ear normal.     Left Ear: Tympanic membrane, ear canal and  external ear normal.  Eyes:     Extraocular Movements: Extraocular movements intact.     Pupils: Pupils are equal, round, and reactive to light.  Cardiovascular:     Rate and Rhythm: Normal rate and regular rhythm.     Heart sounds: Normal heart sounds.  Pulmonary:     Effort: Pulmonary effort is normal.     Breath sounds: Normal breath sounds.  Musculoskeletal:     Right lower leg: No edema.     Left lower leg: No edema.  Neurological:     General: No focal deficit present.     Mental Status: She is alert and oriented to person, place, and time.     Cranial Nerves: Cranial nerves 2-12 are intact.     Motor: No weakness.     Comments: Good grip strength bilaterally.   Psychiatric:        Mood and Affect: Mood normal.        Behavior: Behavior normal.        Thought Content: Thought content normal.        Judgment: Judgment normal.      Results for orders placed or performed in visit on 07/14/24  CBC with Differential/Platelet  Result Value Ref Range   WBC 9.0 3.8 - 10.8 Thousand/uL   RBC 4.34 3.80 - 5.10 Million/uL   Hemoglobin 12.0 11.7 - 15.5 g/dL   HCT 63.3 64.9 - 54.9 %   MCV 84.3 80.0 - 100.0 fL   MCH 27.6 27.0 - 33.0 pg   MCHC 32.8 32.0 - 36.0 g/dL   RDW 86.7 88.9 - 84.9 %   Platelets 373 140 - 400 Thousand/uL   MPV 9.3 7.5 - 12.5 fL   Neutro Abs 5,274 1,500 - 7,800 cells/uL   Absolute Lymphocytes 2,799 850 - 3,900 cells/uL   Absolute Monocytes 612 200 - 950 cells/uL   Eosinophils Absolute 261 15 - 500 cells/uL   Basophils Absolute 54 0 - 200 cells/uL   Neutrophils Relative % 58.6 %   Total Lymphocyte 31.1 %   Monocytes Relative 6.8 %   Eosinophils Relative 2.9 %   Basophils Relative 0.6 %  Comprehensive metabolic panel with GFR  Result Value Ref Range   Glucose, Bld 90 65 - 99 mg/dL   BUN 12 7 - 25 mg/dL   Creat 9.39 9.49 - 9.02 mg/dL   eGFR 878 > OR = 60 fO/fpw/8.26f7   BUN/Creatinine Ratio SEE NOTE: 6 - 22 (calc)   Sodium 139 135 - 146 mmol/L    Potassium 4.3 3.5 - 5.3 mmol/L   Chloride  101 98 - 110 mmol/L   CO2 27 20 - 32 mmol/L   Calcium 9.3 8.6 - 10.2 mg/dL   Total Protein 7.4 6.1 - 8.1 g/dL   Albumin 4.1 3.6 - 5.1 g/dL   Globulin 3.3 1.9 - 3.7 g/dL (calc)   AG Ratio 1.2 1.0 - 2.5 (calc)   Total Bilirubin 0.6 0.2 - 1.2 mg/dL   Alkaline phosphatase (APISO) 83 31 - 125 U/L   AST 51 (H) 10 - 30 U/L   ALT 52 (H) 6 - 29 U/L  Hemoglobin A1c  Result Value Ref Range   Hgb A1c MFr Bld 5.9 (H) <5.7 %   Mean Plasma Glucose 123 mg/dL   eAG (mmol/L) 6.8 mmol/L  Lipid panel  Result Value Ref Range   Cholesterol 187 <200 mg/dL   HDL 39 (L) > OR = 50 mg/dL   Triglycerides 762 (H) <150 mg/dL   LDL Cholesterol (Calc) 112 (H) mg/dL (calc)   Total CHOL/HDL Ratio 4.8 <5.0 (calc)   Non-HDL Cholesterol (Calc) 148 (H) <130 mg/dL (calc)  TSH  Result Value Ref Range   TSH 2.91 mIU/L  Vitamin B12  Result Value Ref Range   Vitamin B-12 258 200 - 1,100 pg/mL  Iron, TIBC and Ferritin Panel  Result Value Ref Range   Iron 26 (L) 40 - 190 mcg/dL   TIBC 707 749 - 549 mcg/dL (calc)   %SAT 9 (L) 16 - 45 % (calc)   Ferritin 37 16 - 154 ng/mL

## 2024-07-15 ENCOUNTER — Ambulatory Visit: Payer: Self-pay | Admitting: Family Medicine

## 2024-07-15 LAB — HEMOGLOBIN A1C
Hgb A1c MFr Bld: 5.9 % — ABNORMAL HIGH (ref <5.7)
Mean Plasma Glucose: 123 mg/dL
eAG (mmol/L): 6.8 mmol/L

## 2024-07-15 LAB — CBC WITH DIFFERENTIAL/PLATELET
Absolute Lymphocytes: 2799 {cells}/uL (ref 850–3900)
Absolute Monocytes: 612 {cells}/uL (ref 200–950)
Basophils Absolute: 54 {cells}/uL (ref 0–200)
Basophils Relative: 0.6 %
Eosinophils Absolute: 261 {cells}/uL (ref 15–500)
Eosinophils Relative: 2.9 %
HCT: 36.6 % (ref 35.0–45.0)
Hemoglobin: 12 g/dL (ref 11.7–15.5)
MCH: 27.6 pg (ref 27.0–33.0)
MCHC: 32.8 g/dL (ref 32.0–36.0)
MCV: 84.3 fL (ref 80.0–100.0)
MPV: 9.3 fL (ref 7.5–12.5)
Monocytes Relative: 6.8 %
Neutro Abs: 5274 {cells}/uL (ref 1500–7800)
Neutrophils Relative %: 58.6 %
Platelets: 373 Thousand/uL (ref 140–400)
RBC: 4.34 Million/uL (ref 3.80–5.10)
RDW: 13.2 % (ref 11.0–15.0)
Total Lymphocyte: 31.1 %
WBC: 9 Thousand/uL (ref 3.8–10.8)

## 2024-07-15 LAB — COMPREHENSIVE METABOLIC PANEL WITH GFR
AG Ratio: 1.2 (calc) (ref 1.0–2.5)
ALT: 52 U/L — ABNORMAL HIGH (ref 6–29)
AST: 51 U/L — ABNORMAL HIGH (ref 10–30)
Albumin: 4.1 g/dL (ref 3.6–5.1)
Alkaline phosphatase (APISO): 83 U/L (ref 31–125)
BUN: 12 mg/dL (ref 7–25)
CO2: 27 mmol/L (ref 20–32)
Calcium: 9.3 mg/dL (ref 8.6–10.2)
Chloride: 101 mmol/L (ref 98–110)
Creat: 0.6 mg/dL (ref 0.50–0.97)
Globulin: 3.3 g/dL (ref 1.9–3.7)
Glucose, Bld: 90 mg/dL (ref 65–99)
Potassium: 4.3 mmol/L (ref 3.5–5.3)
Sodium: 139 mmol/L (ref 135–146)
Total Bilirubin: 0.6 mg/dL (ref 0.2–1.2)
Total Protein: 7.4 g/dL (ref 6.1–8.1)
eGFR: 121 mL/min/1.73m2 (ref >=60)

## 2024-07-15 LAB — IRON,TIBC AND FERRITIN PANEL
%SAT: 9 % — ABNORMAL LOW (ref 16–45)
Ferritin: 37 ng/mL (ref 16–154)
Iron: 26 ug/dL — ABNORMAL LOW (ref 40–190)
TIBC: 292 ug/dL (ref 250–450)

## 2024-07-15 LAB — LIPID PANEL
Cholesterol: 187 mg/dL (ref <200)
HDL: 39 mg/dL — ABNORMAL LOW (ref >=50)
LDL Cholesterol (Calc): 112 mg/dL — ABNORMAL HIGH
Non-HDL Cholesterol (Calc): 148 mg/dL — ABNORMAL HIGH (ref <130)
Total CHOL/HDL Ratio: 4.8 (calc) (ref <5.0)
Triglycerides: 237 mg/dL — ABNORMAL HIGH (ref <150)

## 2024-07-15 LAB — VITAMIN B12: Vitamin B-12: 258 pg/mL (ref 200–1100)

## 2024-07-15 LAB — TSH: TSH: 2.91 m[IU]/L

## 2024-07-15 NOTE — Addendum Note (Signed)
 Addended by: ALETHA CONNIE HERO on: 07/15/2024 08:07 AM   Modules accepted: Level of Service

## 2024-07-30 ENCOUNTER — Encounter: Payer: Self-pay | Admitting: Family Medicine

## 2024-08-08 ENCOUNTER — Encounter: Payer: Self-pay | Admitting: Family Medicine

## 2024-08-08 ENCOUNTER — Ambulatory Visit: Admitting: Family Medicine

## 2024-08-08 VITALS — BP 124/78 | HR 80 | Ht 61.0 in | Wt 290.2 lb

## 2024-08-08 DIAGNOSIS — R7303 Prediabetes: Secondary | ICD-10-CM | POA: Diagnosis not present

## 2024-08-08 DIAGNOSIS — F419 Anxiety disorder, unspecified: Secondary | ICD-10-CM | POA: Diagnosis not present

## 2024-08-08 DIAGNOSIS — Z0289 Encounter for other administrative examinations: Secondary | ICD-10-CM

## 2024-08-08 DIAGNOSIS — I10 Essential (primary) hypertension: Secondary | ICD-10-CM

## 2024-08-08 NOTE — Progress Notes (Addendum)
 Patient Office Visit  Assessment & Plan:  Benign essential hypertension  Encounter for completion of form with patient  Prediabetes  Anxiety   Assessment and Plan    Essential hypertension Well-controlled with current medication. - Continue metoprolol .  Prediabetes A1c borderline, requires monitoring. - Continue metformin  twice daily.     Form completed and given to the patient. Recommend healthy diet i.e mediterranean/DASH diet, consistent exercise - 30 minutes 5 day per week, and gradual weight loss.     Return in about 4 months (around 12/09/2024), or if symptoms worsen or fail to improve.   Subjective:    Patient ID: Connie Valencia, female    DOB: Apr 13, 1991  Age: 33 y.o. MRN: [id number]  Chief Complaint  Patient presents with   Form Completion         HPI Discussed the use of AI scribe software for clinical note transcription with the patient, who gave verbal consent to proceed.  History of Present Illness        History of Present Illness Connie Valencia is a 33 year old female with hypertension, prediabetes, and anxiety who presents for a routine follow-up adoption form completion  She has a history of hypertension and takes metoprolol . No recent hospitalizations related to her hypertension.  She has prediabetes and is taking metformin  twice daily, initially started for fertility reasons. Her A1c is described as borderline. There is a family history of diabetes and cardiovascular issues in her mom.  She is taking Zoloft  for anxiety which is well controlled.   She denies smoking and reports good nutrition, stating 'we eat all our fruits and vegetables.' She has two dogs and two cats and has been involved in activities such as pet vaccinations and preparing for the introduction of a baby to the household. She reports wearing glasses. No recent hospitalizations.  Physical Exam MEASUREMENTS: Height- 5'1.  Assessment and Plan Essential  hypertension Well-controlled with current medication. - Continue metoprolol .  Prediabetes A1c borderline, requires monitoring. - Continue metformin  twice daily.    The ASCVD Risk score (Arnett DK, et al., 2019) failed to calculate for the following reasons:   The 2019 ASCVD risk score is only valid for ages 46 to 67  Past Medical History:  Diagnosis Date   Allergy    Anxiety    Hypertension    Past Surgical History:  Procedure Laterality Date   JOINT REPLACEMENT Left    knee- 10/17/2022   Social History   Tobacco Use   Smoking status: Never   Smokeless tobacco: Never  Vaping Use   Vaping status: Never Used  Substance Use Topics   Alcohol use: No   Drug use: No   Family History  Problem Relation Age of Onset   Diabetes Mother    Heart disease Mother    Obesity Mother    Diabetes Father    Hypertension Father    Obesity Father    Allergies  Allergen Reactions   Egg Protein-Containing Drug Products Cough and Other (See Comments)    Increased congestion and postnasal drainage causing paroxysmal coughing   Influenza Vaccines Cough   Other Rash    Yogurt    ROS    Objective:    BP 124/78   Pulse 80   Ht 5' 1 (1.549 m)   Wt 290 lb 4 oz (131.7 kg)   LMP 05/16/2024 Comment: Pt states that she has been on her menstrual cycle for 60mo.  SpO2 98%   BMI 54.84  kg/m  BP Readings from Last 3 Encounters:  08/08/24 124/78  07/14/24 122/88  12/31/23 131/80   Wt Readings from Last 3 Encounters:  08/08/24 290 lb 4 oz (131.7 kg)  07/14/24 292 lb 6 oz (132.6 kg)  12/31/23 274 lb (124.3 kg)    Physical Exam Vitals and nursing note reviewed.  Constitutional:      General: She is not in acute distress.    Appearance: Normal appearance.  HENT:     Head: Normocephalic.     Right Ear: Tympanic membrane, ear canal and external ear normal.     Left Ear: Tympanic membrane, ear canal and external ear normal.  Eyes:     Extraocular Movements: Extraocular movements  intact.     Pupils: Pupils are equal, round, and reactive to light.  Cardiovascular:     Rate and Rhythm: Normal rate and regular rhythm.     Heart sounds: Normal heart sounds.  Pulmonary:     Effort: Pulmonary effort is normal.     Breath sounds: Normal breath sounds. No wheezing.  Musculoskeletal:     Right lower leg: No edema.     Left lower leg: No edema.  Neurological:     General: No focal deficit present.     Mental Status: She is alert and oriented to person, place, and time. Mental status is at baseline.  Psychiatric:        Attention and Perception: Attention normal.        Mood and Affect: Mood normal.        Speech: Speech normal.        Behavior: Behavior normal.        Thought Content: Thought content normal.        Cognition and Memory: Cognition normal.        Judgment: Judgment normal.      No results found for any visits on 08/08/24.
